# Patient Record
Sex: Female | Born: 1935 | Race: White | Hispanic: No | Smoking: Never smoker
Health system: Southern US, Community
[De-identification: ages and names within clinical notes are randomized; demographics above are authoritative.]

## PROBLEM LIST (undated history)

## (undated) DIAGNOSIS — M5136 Other intervertebral disc degeneration, lumbar region: Secondary | ICD-10-CM

## (undated) DIAGNOSIS — C801 Malignant (primary) neoplasm, unspecified: Secondary | ICD-10-CM

## (undated) HISTORY — PX: COLONOSCOPY W/ POLYPECTOMY: SHX1380

## (undated) HISTORY — PX: BREAST SURGERY: SHX581

---

## 2005-02-10 ENCOUNTER — Ambulatory Visit: Payer: Self-pay | Admitting: Cardiology

## 2007-12-24 ENCOUNTER — Ambulatory Visit: Payer: Self-pay | Admitting: Cardiology

## 2009-06-30 ENCOUNTER — Ambulatory Visit: Payer: Self-pay | Admitting: Cardiology

## 2010-01-03 ENCOUNTER — Encounter (INDEPENDENT_AMBULATORY_CARE_PROVIDER_SITE_OTHER): Payer: Self-pay | Admitting: Surgery

## 2010-01-03 ENCOUNTER — Ambulatory Visit (HOSPITAL_COMMUNITY): Admission: RE | Admit: 2010-01-03 | Discharge: 2010-01-04 | Payer: Self-pay | Admitting: Surgery

## 2010-07-22 LAB — SURGICAL PCR SCREEN
MRSA, PCR: NEGATIVE
Staphylococcus aureus: NEGATIVE

## 2010-07-22 LAB — URINALYSIS, ROUTINE W REFLEX MICROSCOPIC
Nitrite: POSITIVE — AB
Specific Gravity, Urine: 1.016 (ref 1.005–1.030)

## 2010-07-22 LAB — DIFFERENTIAL
Basophils Absolute: 0.1 10*3/uL (ref 0.0–0.1)
Basophils Relative: 2 % — ABNORMAL HIGH (ref 0–1)
Eosinophils Absolute: 0 10*3/uL (ref 0.0–0.7)
Eosinophils Relative: 1 % (ref 0–5)
Lymphocytes Relative: 40 % (ref 12–46)
Lymphs Abs: 1.4 10*3/uL (ref 0.7–4.0)
Monocytes Absolute: 0.3 10*3/uL (ref 0.1–1.0)
Monocytes Relative: 10 % (ref 3–12)
Neutro Abs: 1.7 10*3/uL (ref 1.7–7.7)
Neutrophils Relative %: 47 % (ref 43–77)

## 2010-07-22 LAB — CBC
HCT: 37 % (ref 36.0–46.0)
Hemoglobin: 12.5 g/dL (ref 12.0–15.0)
MCH: 31 pg (ref 26.0–34.0)
MCHC: 33.8 g/dL (ref 30.0–36.0)
MCV: 91.6 fL (ref 78.0–100.0)
Platelets: 116 10*3/uL — ABNORMAL LOW (ref 150–400)
RBC: 4.03 MIL/uL (ref 3.87–5.11)
RDW: 13 % (ref 11.5–15.5)
WBC: 3.6 10*3/uL — ABNORMAL LOW (ref 4.0–10.5)

## 2010-07-22 LAB — URINE MICROSCOPIC-ADD ON

## 2010-07-22 LAB — BASIC METABOLIC PANEL WITH GFR
BUN: 10 mg/dL (ref 6–23)
CO2: 28 meq/L (ref 19–32)
Calcium: 10.3 mg/dL (ref 8.4–10.5)
Chloride: 108 meq/L (ref 96–112)
Creatinine, Ser: 0.81 mg/dL (ref 0.4–1.2)
GFR calc non Af Amer: >60 mL/min
Glucose, Bld: 89 mg/dL (ref 70–99)
Potassium: 3.7 meq/L (ref 3.5–5.1)
Sodium: 141 meq/L (ref 135–145)

## 2010-07-22 LAB — PTH, INTACT AND CALCIUM
Calcium, Total (PTH): 10.5 mg/dL (ref 8.4–10.5)
PTH: 297.3 pg/mL — ABNORMAL HIGH (ref 14.0–72.0)

## 2010-07-22 LAB — CALCIUM: Calcium: 8.7 mg/dL (ref 8.4–10.5)

## 2010-07-22 LAB — PROTIME-INR
INR: 1.01 (ref 0.00–1.49)
Prothrombin Time: 13.5 s (ref 11.6–15.2)

## 2011-07-24 ENCOUNTER — Other Ambulatory Visit: Payer: Self-pay | Admitting: Hematology and Oncology

## 2011-07-24 DIAGNOSIS — C50911 Malignant neoplasm of unspecified site of right female breast: Secondary | ICD-10-CM

## 2011-07-29 ENCOUNTER — Other Ambulatory Visit: Payer: Self-pay

## 2011-09-08 IMAGING — CR DG CHEST 2V
2 series · 2 of 2 positions shown · non-contrast
Comparison: None.

CLINICAL DATA: Hyperparathyroidism.  Preoperative respiratory
evaluation prior to parathyroidectomy. Former smoker.

CHEST - 2 VIEW 12/30/2009:

[w chest pa]
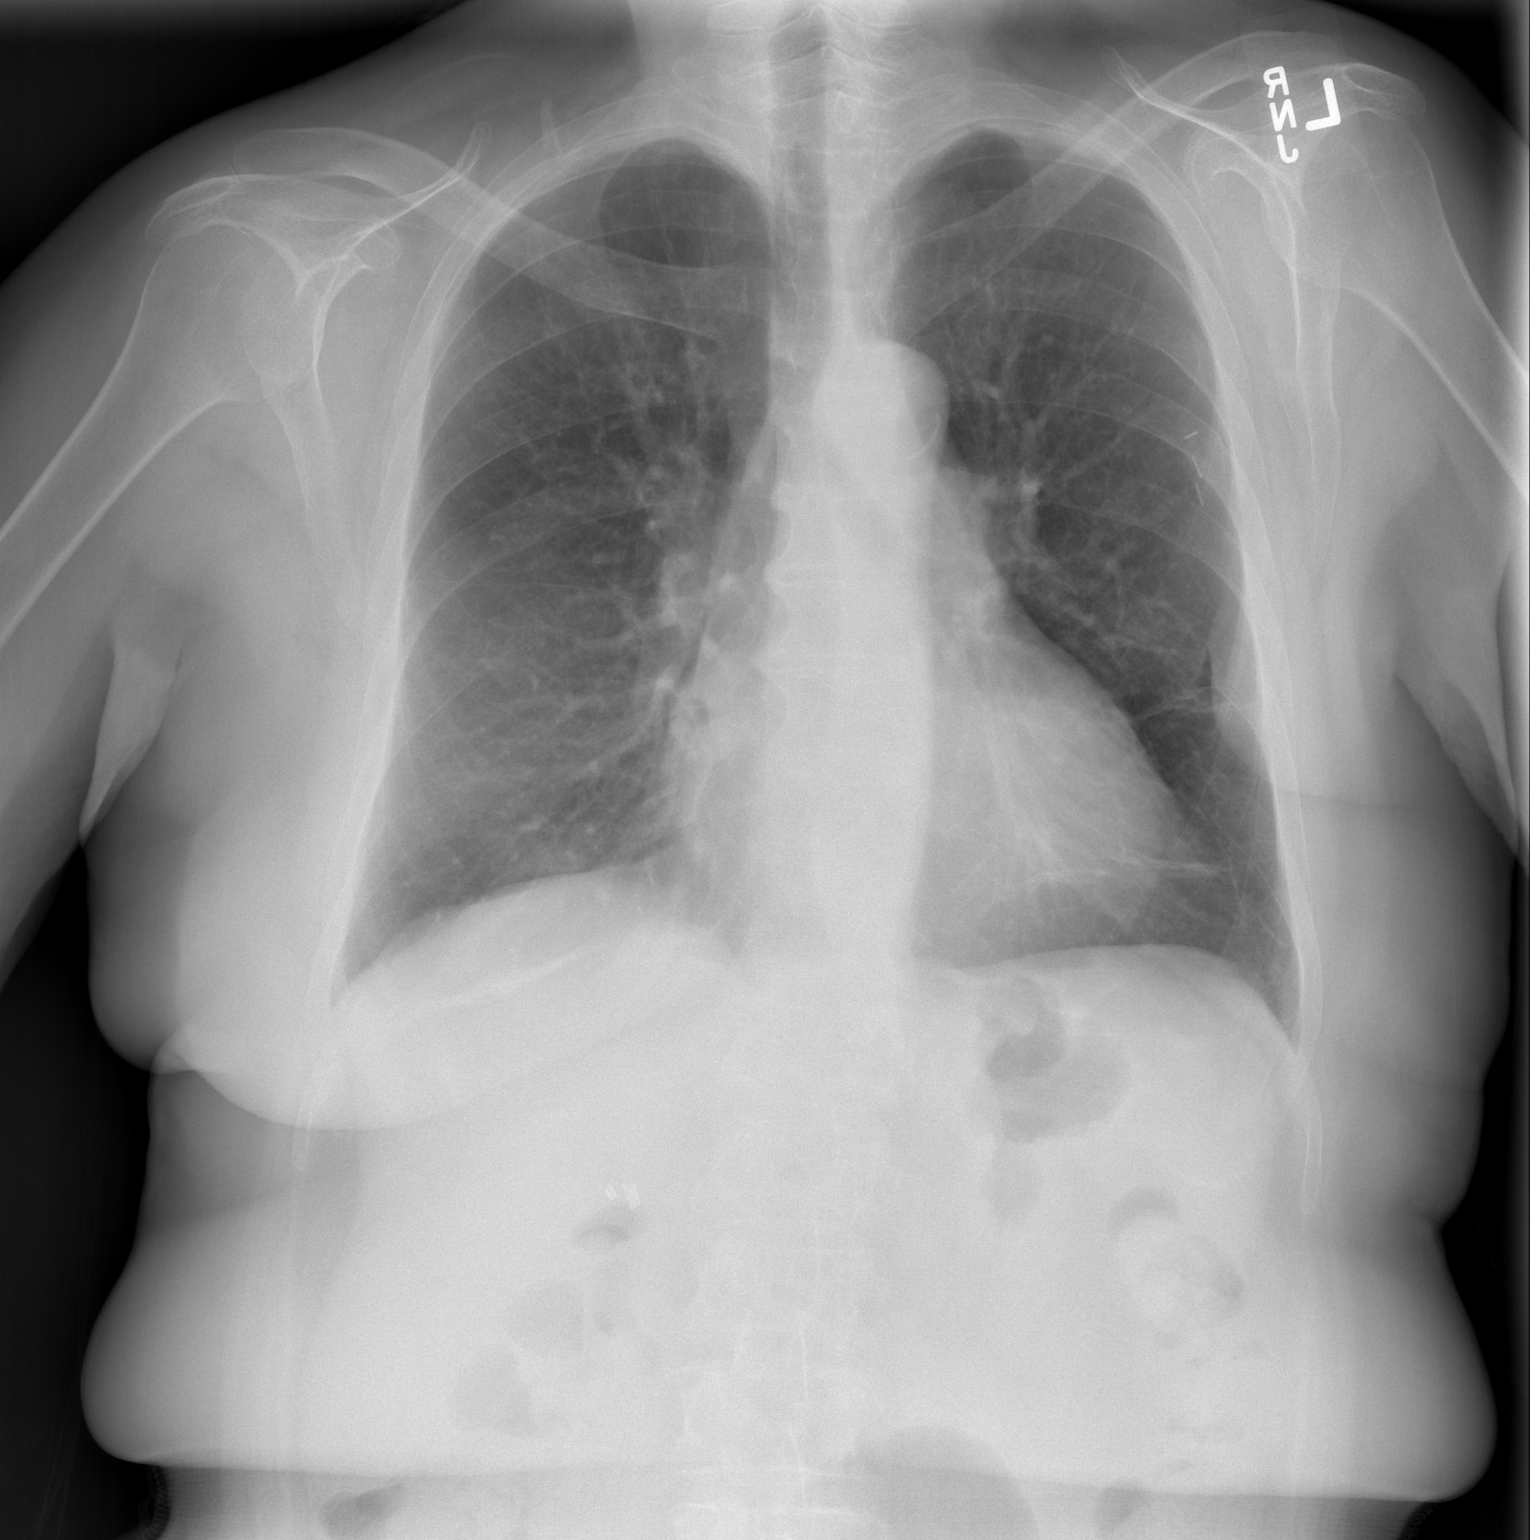

[w chest lat]
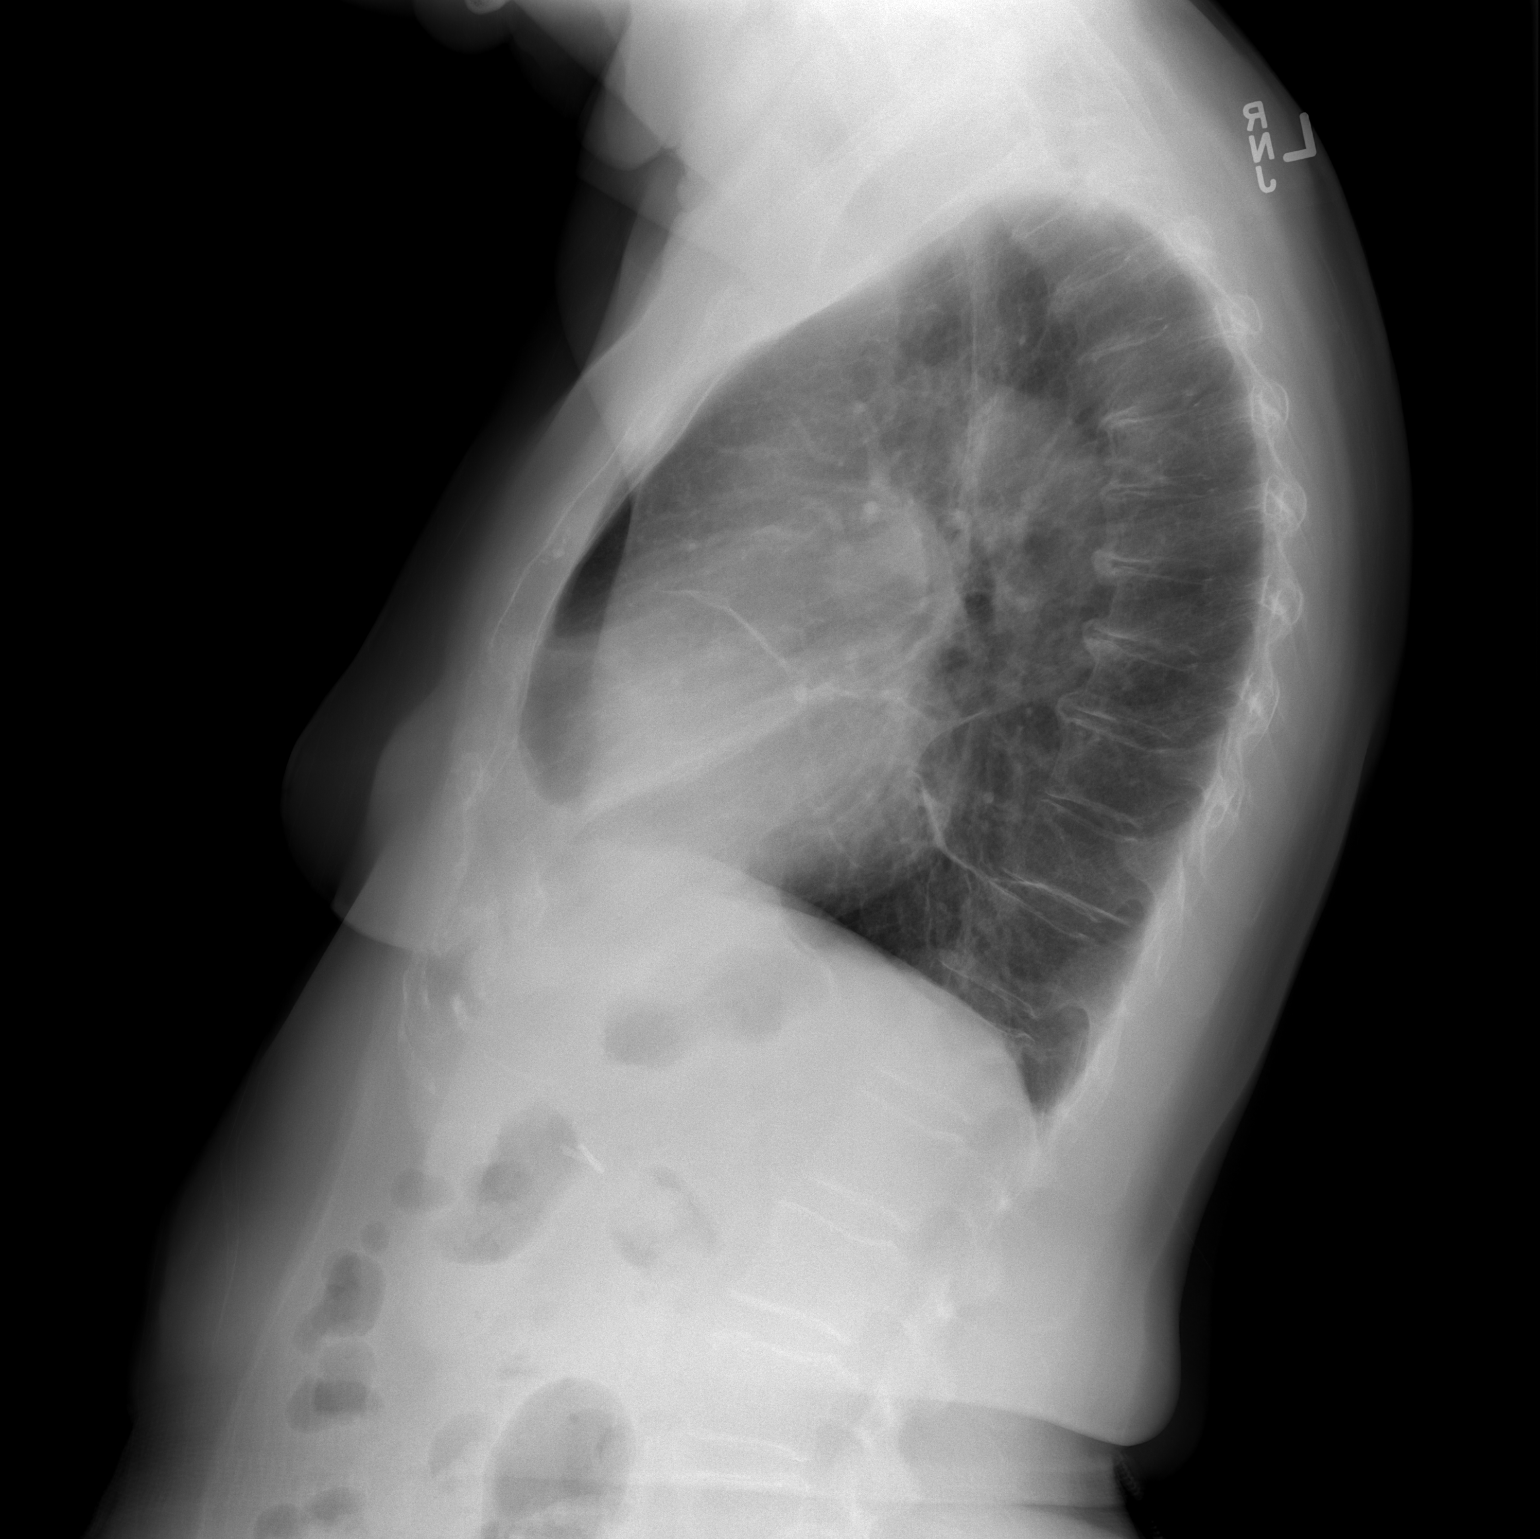

[2 of 2 positions shown; findings below may reference images not displayed]

FINDINGS: Cardiac silhouette upper normal in size to slightly
enlarged.  Thoracic aorta mildly atherosclerotic.  Hilar and
mediastinal contours otherwise unremarkable.  Linear opacities in
the lingula and both lower lobes.  Lungs otherwise clear.  Previous
left mastectomy and axillary node dissection.  No pleural
effusions.  Degenerative changes involving the thoracic spine.
IMPRESSION: Linear atelectasis or scarring in the lingula and both lower lobes.
No acute cardiopulmonary disease otherwise.  Borderline mild
cardiomegaly.

## 2011-09-25 ENCOUNTER — Encounter: Payer: Medicare Other | Admitting: Hematology and Oncology

## 2011-09-25 DIAGNOSIS — M810 Age-related osteoporosis without current pathological fracture: Secondary | ICD-10-CM

## 2011-09-25 DIAGNOSIS — E039 Hypothyroidism, unspecified: Secondary | ICD-10-CM

## 2011-09-25 DIAGNOSIS — C50919 Malignant neoplasm of unspecified site of unspecified female breast: Secondary | ICD-10-CM

## 2012-01-01 ENCOUNTER — Encounter: Payer: Self-pay | Admitting: Hematology and Oncology

## 2012-01-01 DIAGNOSIS — E785 Hyperlipidemia, unspecified: Secondary | ICD-10-CM

## 2012-01-01 DIAGNOSIS — M810 Age-related osteoporosis without current pathological fracture: Secondary | ICD-10-CM

## 2012-01-01 DIAGNOSIS — C50919 Malignant neoplasm of unspecified site of unspecified female breast: Secondary | ICD-10-CM

## 2012-02-20 DIAGNOSIS — C50919 Malignant neoplasm of unspecified site of unspecified female breast: Secondary | ICD-10-CM

## 2012-02-20 DIAGNOSIS — L905 Scar conditions and fibrosis of skin: Secondary | ICD-10-CM

## 2012-02-20 DIAGNOSIS — E876 Hypokalemia: Secondary | ICD-10-CM

## 2012-02-20 DIAGNOSIS — M810 Age-related osteoporosis without current pathological fracture: Secondary | ICD-10-CM

## 2012-03-01 DIAGNOSIS — C50919 Malignant neoplasm of unspecified site of unspecified female breast: Secondary | ICD-10-CM

## 2012-03-01 DIAGNOSIS — M810 Age-related osteoporosis without current pathological fracture: Secondary | ICD-10-CM

## 2012-03-01 DIAGNOSIS — E785 Hyperlipidemia, unspecified: Secondary | ICD-10-CM

## 2012-05-21 ENCOUNTER — Encounter: Payer: Medicare Other | Admitting: Internal Medicine

## 2012-05-21 DIAGNOSIS — D696 Thrombocytopenia, unspecified: Secondary | ICD-10-CM

## 2012-05-21 DIAGNOSIS — C50919 Malignant neoplasm of unspecified site of unspecified female breast: Secondary | ICD-10-CM

## 2012-05-21 DIAGNOSIS — E876 Hypokalemia: Secondary | ICD-10-CM

## 2012-08-09 DIAGNOSIS — M810 Age-related osteoporosis without current pathological fracture: Secondary | ICD-10-CM

## 2012-08-09 DIAGNOSIS — E785 Hyperlipidemia, unspecified: Secondary | ICD-10-CM

## 2012-08-09 DIAGNOSIS — E876 Hypokalemia: Secondary | ICD-10-CM

## 2012-08-09 DIAGNOSIS — C50919 Malignant neoplasm of unspecified site of unspecified female breast: Secondary | ICD-10-CM

## 2012-08-15 DIAGNOSIS — C50919 Malignant neoplasm of unspecified site of unspecified female breast: Secondary | ICD-10-CM

## 2012-08-15 DIAGNOSIS — M810 Age-related osteoporosis without current pathological fracture: Secondary | ICD-10-CM

## 2013-01-13 DIAGNOSIS — R55 Syncope and collapse: Secondary | ICD-10-CM

## 2013-02-25 DIAGNOSIS — D72819 Decreased white blood cell count, unspecified: Secondary | ICD-10-CM

## 2013-02-25 DIAGNOSIS — M810 Age-related osteoporosis without current pathological fracture: Secondary | ICD-10-CM

## 2013-02-25 DIAGNOSIS — M899 Disorder of bone, unspecified: Secondary | ICD-10-CM

## 2013-02-25 DIAGNOSIS — C50919 Malignant neoplasm of unspecified site of unspecified female breast: Secondary | ICD-10-CM

## 2013-02-25 DIAGNOSIS — D649 Anemia, unspecified: Secondary | ICD-10-CM

## 2013-02-25 DIAGNOSIS — D696 Thrombocytopenia, unspecified: Secondary | ICD-10-CM

## 2013-02-25 DIAGNOSIS — M949 Disorder of cartilage, unspecified: Secondary | ICD-10-CM

## 2013-03-18 DIAGNOSIS — E785 Hyperlipidemia, unspecified: Secondary | ICD-10-CM | POA: Insufficient documentation

## 2013-03-18 DIAGNOSIS — D649 Anemia, unspecified: Secondary | ICD-10-CM | POA: Insufficient documentation

## 2013-03-18 DIAGNOSIS — M810 Age-related osteoporosis without current pathological fracture: Secondary | ICD-10-CM | POA: Insufficient documentation

## 2013-03-18 DIAGNOSIS — D72819 Decreased white blood cell count, unspecified: Secondary | ICD-10-CM | POA: Insufficient documentation

## 2013-04-17 DIAGNOSIS — E876 Hypokalemia: Secondary | ICD-10-CM

## 2013-04-17 DIAGNOSIS — D72819 Decreased white blood cell count, unspecified: Secondary | ICD-10-CM

## 2013-04-17 DIAGNOSIS — C50919 Malignant neoplasm of unspecified site of unspecified female breast: Secondary | ICD-10-CM

## 2013-04-17 DIAGNOSIS — D696 Thrombocytopenia, unspecified: Secondary | ICD-10-CM

## 2013-05-09 DIAGNOSIS — S2249XA Multiple fractures of ribs, unspecified side, initial encounter for closed fracture: Secondary | ICD-10-CM | POA: Insufficient documentation

## 2013-05-09 DIAGNOSIS — Y92009 Unspecified place in unspecified non-institutional (private) residence as the place of occurrence of the external cause: Secondary | ICD-10-CM

## 2013-05-09 DIAGNOSIS — W19XXXA Unspecified fall, initial encounter: Secondary | ICD-10-CM | POA: Insufficient documentation

## 2013-05-09 DIAGNOSIS — R739 Hyperglycemia, unspecified: Secondary | ICD-10-CM | POA: Insufficient documentation

## 2013-05-09 DIAGNOSIS — S2239XA Fracture of one rib, unspecified side, initial encounter for closed fracture: Secondary | ICD-10-CM | POA: Insufficient documentation

## 2013-05-09 DIAGNOSIS — R4020 Unspecified coma: Secondary | ICD-10-CM | POA: Insufficient documentation

## 2013-05-13 DIAGNOSIS — E876 Hypokalemia: Secondary | ICD-10-CM | POA: Insufficient documentation

## 2013-06-11 ENCOUNTER — Emergency Department (HOSPITAL_COMMUNITY)
Admission: EM | Admit: 2013-06-11 | Discharge: 2013-06-11 | Disposition: A | Discharge status: Home / Self Care | Payer: Medicare Other | Attending: Emergency Medicine | Admitting: Emergency Medicine

## 2013-06-11 ENCOUNTER — Encounter (HOSPITAL_COMMUNITY): Payer: Self-pay | Admitting: Emergency Medicine

## 2013-06-11 ENCOUNTER — Emergency Department (HOSPITAL_COMMUNITY): Payer: Medicare Other

## 2013-06-11 DIAGNOSIS — IMO0002 Reserved for concepts with insufficient information to code with codable children: Secondary | ICD-10-CM | POA: Insufficient documentation

## 2013-06-11 DIAGNOSIS — Y939 Activity, unspecified: Secondary | ICD-10-CM | POA: Insufficient documentation

## 2013-06-11 DIAGNOSIS — W06XXXA Fall from bed, initial encounter: Secondary | ICD-10-CM | POA: Insufficient documentation

## 2013-06-11 DIAGNOSIS — S298XXA Other specified injuries of thorax, initial encounter: Secondary | ICD-10-CM | POA: Insufficient documentation

## 2013-06-11 DIAGNOSIS — Z853 Personal history of malignant neoplasm of breast: Secondary | ICD-10-CM | POA: Insufficient documentation

## 2013-06-11 DIAGNOSIS — R0781 Pleurodynia: Secondary | ICD-10-CM

## 2013-06-11 DIAGNOSIS — Y9289 Other specified places as the place of occurrence of the external cause: Secondary | ICD-10-CM | POA: Insufficient documentation

## 2013-06-11 DIAGNOSIS — Z79899 Other long term (current) drug therapy: Secondary | ICD-10-CM | POA: Insufficient documentation

## 2013-06-11 DIAGNOSIS — Z880 Allergy status to penicillin: Secondary | ICD-10-CM | POA: Insufficient documentation

## 2013-06-11 DIAGNOSIS — Z7982 Long term (current) use of aspirin: Secondary | ICD-10-CM | POA: Insufficient documentation

## 2013-06-11 DIAGNOSIS — Z8781 Personal history of (healed) traumatic fracture: Secondary | ICD-10-CM | POA: Insufficient documentation

## 2013-06-11 HISTORY — DX: Malignant (primary) neoplasm, unspecified: C80.1

## 2013-06-11 MED ORDER — HYDROCODONE-ACETAMINOPHEN 5-325 MG PO TABS
1.0000 | ORAL_TABLET | Freq: Once | ORAL | Status: AC
Start: 2013-06-11 — End: 2013-06-11
  Administered 2013-06-11: 1 via ORAL
  Filled 2013-06-11: qty 1

## 2013-06-11 MED ORDER — HYDROCODONE-ACETAMINOPHEN 10-325 MG PO TABS
1.0000 | ORAL_TABLET | Freq: Four times a day (QID) | ORAL | Status: DC | PRN
Start: 2013-06-11 — End: 2014-02-21

## 2013-06-11 NOTE — Discharge Instructions (Signed)
As discussed, with your rib injuries it is very important that you monitor your condition carefully, and be sure to use your incentive spirometer 4 times daily while he recovered.  Return here for concerning changes in your condition, otherwise, please be sure to follow up with your physician.

## 2013-06-11 NOTE — ED Provider Notes (Signed)
CSN: [id number]     Arrival date & time 06/11/13  1642 History   First MD Initiated Contact with Patient 06/11/13 1917     Chief Complaint  Patient presents with  . rib pain   . Back Pain    HPI  Patient presents with increasing pain about the left mid lateral thorax. Just prior to arrival the patient had a fall. She will golf of a child's bed. Since that time she's had increasing pain in the area. Pain is severe, sore, nonradiating. There is no new dyspnea, other chest pain, lightheadedness, syncope, head pain, neck pain.  Patient's history is notable for a fall one month ago that resulted in multiple rib fractures, with a pneumothorax requiring hospitalization at another facility for several days.   Past Medical History  Diagnosis Date  . Cancer     breast cancer   Past Surgical History  Procedure Laterality Date  . Breast surgery      double mastectomy   No family history on file. History  Substance Use Topics  . Smoking status: Never Smoker   . Smokeless tobacco: Not on file  . Alcohol Use: No   OB History   Grav Para Term Preterm Abortions TAB SAB Ect Mult Living                 Review of Systems  Constitutional:       Per HPI, otherwise negative  HENT:       Per HPI, otherwise negative  Respiratory:       Per HPI, otherwise negative  Cardiovascular:       Per HPI, otherwise negative  Gastrointestinal: Negative for nausea and vomiting.  Endocrine:       Negative aside from HPI  Genitourinary:       Neg aside from HPI   Musculoskeletal:       Per HPI, otherwise negative  Skin: Negative for wound.  Neurological: Negative for syncope.    Allergies  Penicillins and Tape  Home Medications   Current Outpatient Rx  Name  Route  Sig  Dispense  Refill  . aspirin EC 81 MG tablet   Oral   Take 81 mg by mouth daily.         Marland Kitchen atorvastatin (LIPITOR) 10 MG tablet   Oral   Take 10 mg by mouth daily.         . Cholecalciferol (VITAMIN D PO)   Oral   Take 1 capsule by mouth daily.         Marland Kitchen letrozole (FEMARA) 2.5 MG tablet   Oral   Take 2.5 mg by mouth daily.         . potassium chloride SA (K-DUR,KLOR-CON) 20 MEQ tablet   Oral   Take 40 mEq by mouth daily.         Marland Kitchen HYDROcodone-acetaminophen (NORCO) 10-325 MG per tablet   Oral   Take 1 tablet by mouth every 6 (six) hours as needed.   20 tablet   0    BP 123/87  Pulse 110  Temp(Src) 98.3 F (36.8 C) (Oral)  Resp 18  SpO2 95% Physical Exam  Nursing note and vitals reviewed. Constitutional: She is oriented to person, place, and time. She appears well-developed and well-nourished. No distress.  HENT:  Head: Normocephalic and atraumatic.  Eyes: Conjunctivae and EOM are normal.  Cardiovascular: Normal rate and regular rhythm.   Pulmonary/Chest: Effort normal and breath sounds normal. No stridor. No respiratory distress.  Abdominal: She exhibits no distension.  Musculoskeletal: She exhibits no edema.  Neurological: She is alert and oriented to person, place, and time. No cranial nerve deficit.  Skin: Skin is warm and dry.  Psychiatric: She has a normal mood and affect.    ED Course  Procedures (including critical care time) Labs Review Labs Reviewed - No data to display Imaging Review Dg Ribs Unilateral W/chest Left  06/11/2013   CLINICAL DATA:  Status post fall 05/08/2013 with multiple left rib fractures. Patient status post fall today.  EXAM: LEFT RIBS AND CHEST - 3+ VIEW  COMPARISON:  CT chest, abdomen and pelvis 01/13/2013. PA and lateral chest 05/09/2013.  FINDINGS: There are fractures of the left fifth through eighth ribs which appear acute or subacute. There may be a left fifth rib fracture on the prior study. No pneumothorax is identified. Right upper lobe scar is unchanged. Heart size is upper normal.  IMPRESSION: Acute or subacute left fifth through eighth rib fractures. No pneumothorax.   Electronically Signed   By: Inge Rise M.D.   On: 06/11/2013  18:26    During the initial evaluation I reviewed the x-ray images from today with the patient and her daughter.  We compared to his images with those from images immediately following her event. I agree with the interpretation.   EKG Interpretation   None       MDM   1. Rib pain on left side    Patient presents after a mechanical fall with increasing pain in that area that she previously injured.  There is no evidence of pneumothorax, distress, hypoxia.  Patient additional analgesia provided here, was in no distress, appropriate for further evaluation, management, monitoring as an outpatient.    Carmin Muskrat, MD 06/11/13 2011

## 2013-06-11 NOTE — ED Notes (Addendum)
Pt reports accidentally rolled off of bed and c/o pain in lower back and left ribs.  Denies any SOB.  Reports hurts to breathe.  Pt reports is living with her grand daughter at this time because pt fell in Jan and fractured ribs on left side.

## 2014-02-21 ENCOUNTER — Emergency Department (HOSPITAL_COMMUNITY)
Admission: EM | Admit: 2014-02-21 | Discharge: 2014-02-21 | Disposition: A | Discharge status: Home / Self Care | Payer: Medicare Other | Attending: Emergency Medicine | Admitting: Emergency Medicine

## 2014-02-21 ENCOUNTER — Emergency Department (HOSPITAL_COMMUNITY): Payer: Medicare Other

## 2014-02-21 ENCOUNTER — Encounter (HOSPITAL_COMMUNITY): Payer: Self-pay | Admitting: Emergency Medicine

## 2014-02-21 DIAGNOSIS — Z7982 Long term (current) use of aspirin: Secondary | ICD-10-CM | POA: Diagnosis not present

## 2014-02-21 DIAGNOSIS — N390 Urinary tract infection, site not specified: Secondary | ICD-10-CM

## 2014-02-21 DIAGNOSIS — M545 Low back pain, unspecified: Secondary | ICD-10-CM

## 2014-02-21 DIAGNOSIS — Z853 Personal history of malignant neoplasm of breast: Secondary | ICD-10-CM | POA: Diagnosis not present

## 2014-02-21 DIAGNOSIS — Z79899 Other long term (current) drug therapy: Secondary | ICD-10-CM | POA: Diagnosis not present

## 2014-02-21 DIAGNOSIS — Z880 Allergy status to penicillin: Secondary | ICD-10-CM | POA: Diagnosis not present

## 2014-02-21 LAB — URINE MICROSCOPIC-ADD ON

## 2014-02-21 LAB — URINALYSIS, ROUTINE W REFLEX MICROSCOPIC
Bilirubin Urine: NEGATIVE
GLUCOSE, UA: NEGATIVE mg/dL
Ketones, ur: NEGATIVE mg/dL
Nitrite: POSITIVE — AB
PROTEIN: NEGATIVE mg/dL
Specific Gravity, Urine: 1.025 (ref 1.005–1.030)
Urobilinogen, UA: 0.2 mg/dL (ref 0.0–1.0)
pH: 6 (ref 5.0–8.0)

## 2014-02-21 MED ORDER — TRAMADOL HCL 50 MG PO TABS: 50.0000 mg | ORAL_TABLET | Freq: Four times a day (QID) | ORAL | Status: DC | PRN

## 2014-02-21 MED ORDER — STERILE WATER FOR INJECTION IJ SOLN
INTRAMUSCULAR | Status: AC
  Administered 2014-02-21: 10 mL
  Filled 2014-02-21: qty 10

## 2014-02-21 MED ORDER — CEPHALEXIN 500 MG PO CAPS: 500.0000 mg | ORAL_CAPSULE | Freq: Four times a day (QID) | ORAL | Status: DC

## 2014-02-21 MED ORDER — CEFTRIAXONE SODIUM 1 G IJ SOLR
1.0000 g | Freq: Once | INTRAMUSCULAR | Status: AC
  Administered 2014-02-21: 1 g via INTRAMUSCULAR
  Filled 2014-02-21: qty 10

## 2014-02-21 NOTE — ED Notes (Signed)
Pt states lower back started hurting after cleaning apartment. Denies injury.

## 2014-02-21 NOTE — ED Notes (Signed)
MD at bedside. 

## 2014-02-21 NOTE — Discharge Instructions (Signed)
Back Pain, Adult °Low back pain is very common. About 1 in 5 people have back pain. The cause of low back pain is rarely dangerous. The pain often gets better over time. About half of people with a sudden onset of back pain feel better in just 2 weeks. About 8 in 10 people feel better by 6 weeks.  °CAUSES °Some common causes of back pain include: °· Strain of the muscles or ligaments supporting the spine. °· Wear and tear (degeneration) of the spinal discs. °· Arthritis. °· Direct injury to the back. °DIAGNOSIS °Most of the time, the direct cause of low back pain is not known. However, back pain can be treated effectively even when the exact cause of the pain is unknown. Answering your caregiver's questions about your overall health and symptoms is one of the most accurate ways to make sure the cause of your pain is not dangerous. If your caregiver needs more information, he or she may order lab work or imaging tests (X-rays or MRIs). However, even if imaging tests show changes in your back, this usually does not require surgery. °HOME CARE INSTRUCTIONS °For many people, back pain returns. Since low back pain is rarely dangerous, it is often a condition that people can learn to manage on their own.  °· Remain active. It is stressful on the back to sit or stand in one place. Do not sit, drive, or stand in one place for more than 30 minutes at a time. Take short walks on level surfaces as soon as pain allows. Try to increase the length of time you walk each day. °· Do not stay in bed. Resting more than 1 or 2 days can delay your recovery. °· Do not avoid exercise or work. Your body is made to move. It is not dangerous to be active, even though your back may hurt. Your back will likely heal faster if you return to being active before your pain is gone. °· Pay attention to your body when you  bend and lift. Many people have less discomfort when lifting if they bend their knees, keep the load close to their bodies, and  avoid twisting. Often, the most comfortable positions are those that put less stress on your recovering back. °· Find a comfortable position to sleep. Use a firm mattress and lie on your side with your knees slightly bent. If you lie on your back, put a pillow under your knees. °· Only take over-the-counter or prescription medicines as directed by your caregiver. Over-the-counter medicines to reduce pain and inflammation are often the most helpful. Your caregiver may prescribe muscle relaxant drugs. These medicines help dull your pain so you can more quickly return to your normal activities and healthy exercise. °· Put ice on the injured area. °¨ Put ice in a plastic bag. °¨ Place a towel between your skin and the bag. °¨ Leave the ice on for 15-20 minutes, 03-04 times a day for the first 2 to 3 days. After that, ice and heat may be alternated to reduce pain and spasms. °· Ask your caregiver about trying back exercises and gentle massage. This may be of some benefit. °· Avoid feeling anxious or stressed. Stress increases muscle tension and can worsen back pain. It is important to recognize when you are anxious or stressed and learn ways to manage it. Exercise is a great option. °SEEK MEDICAL CARE IF: °· You have pain that is not relieved with rest or medicine. °· You have pain that does not improve in 1 week. °· You have new symptoms. °· You are generally not feeling well. °SEEK   IMMEDIATE MEDICAL CARE IF:  °· You have pain that radiates from your back into your legs. °· You develop new bowel or bladder control problems. °· You have unusual weakness or numbness in your arms or legs. °· You develop nausea or vomiting. °· You develop abdominal pain. °· You feel faint. °Document Released: 04/24/2005 Document Revised: 10/24/2011 Document Reviewed: 08/26/2013 °ExitCare® Patient Information ©2015 [employer]. This information is not intended to replace advice given to you by your health care provider. Make sure you  discuss any questions you have with your health care provider. ° °Urinary Tract Infection °Urinary tract infections (UTIs) can develop anywhere along your urinary tract. Your urinary tract is your body's drainage system for removing wastes and extra water. Your urinary tract includes two kidneys, two ureters, a bladder, and a urethra. Your kidneys are a pair of bean-shaped organs. Each kidney is about the size of your fist. They are located below your ribs, one on each side of your spine. °CAUSES °Infections are caused by microbes, which are microscopic organisms, including fungi, viruses, and bacteria. These organisms are so small that they can only be seen through a microscope. Bacteria are the microbes that most commonly cause UTIs. °SYMPTOMS  °Symptoms of UTIs may vary by age and gender of the patient and by the location of the infection. Symptoms in young women typically include a frequent and intense urge to urinate and a painful, burning feeling in the bladder or urethra during urination. Older women and men are more likely to be tired, shaky, and weak and have muscle aches and abdominal pain. A fever may mean the infection is in your kidneys. Other symptoms of a kidney infection include pain in your back or sides below the ribs, nausea, and vomiting. °DIAGNOSIS °To diagnose a UTI, your caregiver will ask you about your symptoms. Your caregiver also will ask to provide a urine sample. The urine sample will be tested for bacteria and white blood cells. White blood cells are made by your body to help fight infection. °TREATMENT  °Typically, UTIs can be treated with medication. Because most UTIs are caused by a bacterial infection, they usually can be treated with the use of antibiotics. The choice of antibiotic and length of treatment depend on your symptoms and the type of bacteria causing your infection. °HOME CARE INSTRUCTIONS °· If you were prescribed antibiotics, take them exactly as your caregiver  instructs you. Finish the medication even if you feel better after you have only taken some of the medication. °· Drink enough water and fluids to keep your urine clear or pale yellow. °· Avoid caffeine, tea, and carbonated beverages. They tend to irritate your bladder. °· Empty your bladder often. Avoid holding urine for long periods of time. °· Empty your bladder before and after sexual intercourse. °· After a bowel movement, women should cleanse from front to back. Use each tissue only once. °SEEK MEDICAL CARE IF:  °· You have back pain. °· You develop a fever. °· Your symptoms do not begin to resolve within 3 days. °SEEK IMMEDIATE MEDICAL CARE IF:  °· You have severe back pain or lower abdominal pain. °· You develop chills. °· You have nausea or vomiting. °· You have continued burning or discomfort with urination. °MAKE SURE YOU:  °· Understand these instructions. °· Will watch your condition. °· Will get help right away if you are not doing well or get worse. °Document Released: 02/01/2005 Document Revised: 10/24/2011 Document Reviewed: 06/02/2011 °ExitCare® Patient Information ©  2015 [employer]. This information is not intended to replace advice given to you by your health care provider. Make sure you discuss any questions you have with your health care provider.

## 2014-02-21 NOTE — ED Provider Notes (Signed)
CSN: [id number]     Arrival date & time 02/21/14  1447 History   First MD Initiated Contact with Patient 02/21/14 1507     Chief Complaint  Patient presents with  . Back Pain     (Consider location/radiation/quality/duration/timing/severity/associated sxs/prior Treatment) HPI Comments: Patient presents to the ER for evaluation of low back pain. Patient reports that she injured her back 3 days ago. Patient reports that she was cleaning and pick up an object, felt sudden pain in the lower back. Patient reports sharp pain in the lower back that worsens with movement. Pain does not radiate to the legs. No numbness, tingling or weakness in the lower extremities. No change in bowel or bladder function.  Patient is a 78 y.o. female presenting with back pain.  Back Pain   Past Medical History  Diagnosis Date  . Cancer     breast cancer   Past Surgical History  Procedure Laterality Date  . Breast surgery      double mastectomy   History reviewed. No pertinent family history. History  Substance Use Topics  . Smoking status: Never Smoker   . Smokeless tobacco: Not on file  . Alcohol Use: No   OB History   Grav Para Term Preterm Abortions TAB SAB Ect Mult Living                 Review of Systems  Musculoskeletal: Positive for back pain.  All other systems reviewed and are negative.     Allergies  Penicillins and Tape  Home Medications   Prior to Admission medications   Medication Sig Start Date End Date Taking? Authorizing Provider  aspirin EC 81 MG tablet Take 81 mg by mouth daily.   Yes Historical Provider, MD  atorvastatin (LIPITOR) 10 MG tablet Take 10 mg by mouth daily. 04/06/13  Yes Historical Provider, MD  busPIRone (BUSPAR) 10 MG tablet Take 10 mg by mouth daily. 12/22/13  Yes Historical Provider, MD  Cholecalciferol (VITAMIN D3) 2000 UNITS TABS Take 1 tablet by mouth daily.   Yes Historical Provider, MD  letrozole (FEMARA) 2.5 MG tablet Take 2.5 mg by mouth daily.  04/06/13  Yes Historical Provider, MD  potassium chloride SA (K-DUR,KLOR-CON) 20 MEQ tablet Take 40 mEq by mouth daily.   Yes Historical Provider, MD   BP 155/88  Pulse 95  Temp(Src) 97.9 F (36.6 C) (Oral)  Resp 17  Ht 5\' 5"  (1.651 m)  Wt 122 lb (55.339 kg)  BMI 20.30 kg/m2  SpO2 97% Physical Exam  Constitutional: She is oriented to person, place, and time. She appears well-developed and well-nourished. No distress.  HENT:  Head: Normocephalic and atraumatic.  Right Ear: Hearing normal.  Left Ear: Hearing normal.  Nose: Nose normal.  Mouth/Throat: Oropharynx is clear and moist and mucous membranes are normal.  Eyes: Conjunctivae and EOM are normal. Pupils are equal, round, and reactive to light.  Neck: Normal range of motion. Neck supple.  Cardiovascular: Regular rhythm, S1 normal and S2 normal.  Exam reveals no gallop and no friction rub.   No murmur heard. Pulmonary/Chest: Effort normal and breath sounds normal. No respiratory distress. She exhibits no tenderness.  Abdominal: Soft. Normal appearance and bowel sounds are normal. There is no hepatosplenomegaly. There is no tenderness. There is no rebound, no guarding, no tenderness at McBurney's point and negative Murphy's sign. No hernia.  Musculoskeletal: Normal range of motion.       Lumbar back: She exhibits tenderness.  Back:  Neurological: She is alert and oriented to person, place, and time. She has normal strength. No cranial nerve deficit or sensory deficit. Coordination normal. GCS eye subscore is 4. GCS verbal subscore is 5. GCS motor subscore is 6.  Reflex Scores:      Patellar reflexes are 2+ on the right side and 2+ on the left side. Negative straight leg raise bilaterally  Skin: Skin is warm, dry and intact. No rash noted. No cyanosis.  Psychiatric: She has a normal mood and affect. Her speech is normal and behavior is normal. Thought content normal.    ED Course  Procedures (including critical care  time) Labs Review Labs Reviewed  URINALYSIS, ROUTINE W REFLEX MICROSCOPIC - Abnormal; Notable for the following:    APPearance CLOUDY (*)    Hgb urine dipstick TRACE (*)    Nitrite POSITIVE (*)    Leukocytes, UA SMALL (*)    All other components within normal limits  URINE MICROSCOPIC-ADD ON - Abnormal; Notable for the following:    Bacteria, UA MANY (*)    All other components within normal limits  URINE CULTURE    Imaging Review Dg Lumbar Spine Complete  02/21/2014   CLINICAL DATA:  Low back pain 3 days centrally after bending injury.  EXAM: LUMBAR SPINE - COMPLETE 4+ VIEW  COMPARISON:  07/09/2013  FINDINGS: Vertebral body alignment and heights are normal. Disc space heights are relatively well preserved. There is mild spondylosis throughout the lumbar spine. There is no compression fracture or subluxation. There is mild facet arthropathy. There is calcified plaque over the abdominal aorta.  IMPRESSION: Mild spondylosis of the lumbar spine.  No acute findings.   Electronically Signed   By: Marin Olp M.D.   On: 02/21/2014 16:12     EKG Interpretation None      MDM   Final diagnoses:  None   back pain UTI    To the ER for evaluation of low back pain. Patient reports an injury 3 days ago while cleaning her apartment. She does have an element of musculoskeletal back pain. Pain is in the lower back and worsens with movement. She has tenderness without radiation. Normal neurologic exam. Patient does, however, have an abnormal urinalysis. She'll be treated for UTI and also given analgesia for back pain. Followup with primary doctor.    Orpah Greek, MD 02/21/14 [phone]

## 2014-02-24 ENCOUNTER — Encounter: Payer: Self-pay | Admitting: Cardiovascular Disease

## 2014-02-24 ENCOUNTER — Telehealth (HOSPITAL_COMMUNITY): Payer: Self-pay

## 2014-02-24 NOTE — ED Notes (Signed)
Post ED Visit - Positive Culture Follow-up  Culture report reviewed by antimicrobial stewardship pharmacist: []  Wes Kerkhoven, Pharm.D., BCPS [x]  Heide Guile, Pharm.D., BCPS []  Alycia Rossetti, Pharm.D., BCPS []  [place], Pharm.D., BCPS, AAHIVP []  Legrand Como, Pharm.D., BCPS, AAHIVP []  Carly Sabat, Pharm.D. []  Elenor Quinones, Pharm.D.  Positive urine culture Treated with cephalexin, organism sensitive to the same and no further patient follow-up is required at this time.  Ileene Musa 02/24/2014, 11:18 AM

## 2014-02-25 LAB — URINE CULTURE

## 2014-02-26 ENCOUNTER — Telehealth (HOSPITAL_COMMUNITY): Payer: Self-pay

## 2014-02-26 NOTE — ED Notes (Signed)
Post ED Visit - Positive Culture Follow-up  Culture report reviewed by antimicrobial stewardship pharmacist: [x]  Wes [place], Pharm.D., BCPS []  Heide Guile, Pharm.D., BCPS []  Alycia Rossetti, Pharm.D., BCPS []  [place], Pharm.D., BCPS, AAHIVP []  Legrand Como, Pharm.D., BCPS, AAHIVP []  Carly Sabat, Pharm.D. []  Elenor Quinones, Pharm.D.  Positive urine culture Treated with cephalexin, organism sensitive to the same and no further patient follow-up is required at this time.  Ileene Musa 02/26/2014, 12:53 PM

## 2014-03-03 ENCOUNTER — Telehealth: Payer: Self-pay | Admitting: *Deleted

## 2014-03-03 ENCOUNTER — Encounter: Payer: Self-pay | Admitting: Cardiovascular Disease

## 2014-03-03 ENCOUNTER — Ambulatory Visit (INDEPENDENT_AMBULATORY_CARE_PROVIDER_SITE_OTHER): Payer: Medicare Other | Admitting: Cardiovascular Disease

## 2014-03-03 VITALS — BP 115/78 | HR 93 | Ht 65.5 in | Wt 117.0 lb

## 2014-03-03 DIAGNOSIS — R9431 Abnormal electrocardiogram [ECG] [EKG]: Secondary | ICD-10-CM | POA: Insufficient documentation

## 2014-03-03 DIAGNOSIS — Z01818 Encounter for other preprocedural examination: Secondary | ICD-10-CM

## 2014-03-03 DIAGNOSIS — I4439 Other atrioventricular block: Secondary | ICD-10-CM

## 2014-03-03 DIAGNOSIS — I454 Nonspecific intraventricular block: Secondary | ICD-10-CM

## 2014-03-03 NOTE — Telephone Encounter (Signed)
Dr. Anthony Sar notified that patient is clear to proceed as planned for surgery tomorrow.  Office note faxed to:  [phone] Maudie Mercury).

## 2014-03-03 NOTE — Progress Notes (Signed)
Patient ID: Paige Guerrero, female   DOB: 27-Aug-1935, 78 y.o.   MRN: [id number]       CARDIOLOGY CONSULT NOTE  Patient ID: CIANNI MANNY MRN: [id number] DOB/AGE: 1936-03-04 78 y.o.  Admit date: (Not on file) Primary Physician Rory Percy, MD  Reason for Consultation: abnormal ECG  HPI: The patient is a 78 year old woman who has been referred for an abnormal ECG, performed as part of a preoperative evaluation. She has a history of hyperlipidemia and takes Lipitor 10 mg daily. ECG performed on 12/14/2001 demonstrated normal sinus rhythm. She recently had a colonoscopy and had a lesion in the ascending colon which was removed. She had a second lesion and had multiple biopsies performed for found to be benign. She is being scheduled for surgical resection to remove an additional polyp on 03/04/14. Recent labs performed on 03/02/2014 demonstrate white blood cells 2.9, hemoglobin 12.8, platelets 135, BUN 17, creatinine 0.71, AST 22, ALT 14, total bilirubin 1.3, sodium 140, potassium 3.7. ECG performed at [facility] demonstrated normal sinus rhythm, heart rate 84 bpm, PVCs, and possible old anteroseptal infarct. ECG performed in the office today demonstrates normal sinus rhythm with underlying right bundle branch block and an isolated PVC, heart rate 94 bpm, QRS duration of 144 ms.  She denies chest pain altogether. She has to climb 16 steps to her apartment and does so 4 times every day without exertional chest pain. She may have some mild shortness of breath by the time she reaches the top of the stairs but this has been stable and chronic. She denies orthopnea, leg swelling, and paroxysmal nocturnal dyspnea. She denies dizziness, palpitations, lightheadedness and syncope. She passed out last Christmas Eve after drinking 4 shots of alcohol, but does not drink alcohol regularly. She has not been hospitalized this year to the best of her knowledge. She denies hematochezia, melena, nausea,  vomiting, and abdominal pain. She does not smoke.  Soc: Nonsmoker.  FamHx: Noncontributory.  Allergies  Allergen Reactions  . Penicillins Rash  . Tape Rash    Current Outpatient Prescriptions  Medication Sig Dispense Refill  . aspirin EC 81 MG tablet Take 81 mg by mouth daily.      Marland Kitchen atorvastatin (LIPITOR) 10 MG tablet Take 10 mg by mouth daily.      . busPIRone (BUSPAR) 10 MG tablet Take 10 mg by mouth daily.      . cephALEXin (KEFLEX) 500 MG capsule Take 1 capsule (500 mg total) by mouth 4 (four) times daily.  40 capsule  0  . Cholecalciferol (VITAMIN D3) 2000 UNITS TABS Take 1 tablet by mouth daily.      Marland Kitchen letrozole (FEMARA) 2.5 MG tablet Take 2.5 mg by mouth daily.      . potassium chloride SA (K-DUR,KLOR-CON) 20 MEQ tablet Take 40 mEq by mouth daily.      . traMADol (ULTRAM) 50 MG tablet Take 1 tablet (50 mg total) by mouth every 6 (six) hours as needed.  15 tablet  0   No current facility-administered medications for this visit.    Past Medical History  Diagnosis Date  . Cancer     breast cancer    Past Surgical History  Procedure Laterality Date  . Breast surgery      double mastectomy    History   Social History  . Marital Status: Divorced    Spouse Name: N/A    Number of Children: N/A  . Years of Education: N/A   Occupational History  .  Not on file.   Social History Main Topics  . Smoking status: Never Smoker   . Smokeless tobacco: Never Used  . Alcohol Use: No  . Drug Use: No  . Sexual Activity: Not on file   Other Topics Concern  . Not on file   Social History Narrative  . No narrative on file      Prior to Admission medications   Medication Sig Start Date End Date Taking? Authorizing Provider  aspirin EC 81 MG tablet Take 81 mg by mouth daily.   Yes Historical Provider, MD  atorvastatin (LIPITOR) 10 MG tablet Take 10 mg by mouth daily. 04/06/13  Yes Historical Provider, MD  busPIRone (BUSPAR) 10 MG tablet Take 10 mg by mouth daily.  12/22/13  Yes Historical Provider, MD  cephALEXin (KEFLEX) 500 MG capsule Take 1 capsule (500 mg total) by mouth 4 (four) times daily. 02/21/14  Yes Orpah Greek, MD  Cholecalciferol (VITAMIN D3) 2000 UNITS TABS Take 1 tablet by mouth daily.   Yes Historical Provider, MD  letrozole (FEMARA) 2.5 MG tablet Take 2.5 mg by mouth daily. 04/06/13  Yes Historical Provider, MD  potassium chloride SA (K-DUR,KLOR-CON) 20 MEQ tablet Take 40 mEq by mouth daily.   Yes Historical Provider, MD  traMADol (ULTRAM) 50 MG tablet Take 1 tablet (50 mg total) by mouth every 6 (six) hours as needed. 02/21/14  Yes Orpah Greek, MD     Review of systems complete and found to be negative unless listed above in HPI     Physical exam Blood pressure 115/78, pulse 93, height 5' 5.5" (1.664 m), weight 117 lb (53.071 kg). General: NAD Neck: No JVD, no thyromegaly or thyroid nodule.  Lungs: Clear to auscultation bilaterally with normal respiratory effort. CV: Nondisplaced PMI. Regular rate and rhythm, normal S1/S2, no S3/S4, soft I/VI pansystolic murmur over LUSB.  No peripheral edema.  No carotid bruit.  Abdomen: Soft, nontender, no hepatosplenomegaly, no distention.  Skin: Intact without lesions or rashes.  Neurologic: Alert and oriented.  Psych: Normal affect. Extremities: No clubbing or cyanosis.  HEENT: Normal.   ECG: Most recent ECG reviewed.  Labs:   Lab Results  Component Value Date   WBC 3.6* 12/30/2009   HGB 12.5 12/30/2009   HCT 37.0 12/30/2009   MCV 91.6 12/30/2009   PLT 116* 12/30/2009   No results found for this basename: NA, K, CL, CO2, BUN, CREATININE, CALCIUM, LABALBU, PROT, BILITOT, ALKPHOS, ALT, AST, GLUCOSE,  in the last 168 hours No results found for this basename: CKTOTAL, CKMB, CKMBINDEX, TROPONINI    No results found for this basename: CHOL   No results found for this basename: HDL   No results found for this basename: LDLCALC   No results found for this basename:  TRIG   No results found for this basename: CHOLHDL   No results found for this basename: LDLDIRECT         Studies: No results found.  ASSESSMENT AND PLAN:  1. Abnormal ECG/preoperative risk stratification: She appears to have a rate related right bundle branch block. ECG performed at [facility] was at a heart rate of 84 bpm and demonstrated sinus rhythm with PVCs. ECG in our office demonstrated sinus rhythm with a right bundle branch block and an isolated PVC, heart rate 94 bpm. The finding of "old possible anteroseptal infarct" is nonspecific. She denies exertional symptoms altogether and given the fact that she can climb 16 steps 4 times daily without anginal symptoms portends  a favorable prognosis. Her physical exam is relatively unremarkable. I think she can safely proceed with the planned surgery scheduled for 03/04/2014. No noninvasive testing is required at this time. 2. Hyperlipidemia: Continue Lipitor 10 mg daily.  Dispo: f/u prn.  Signed: Kate Sable, M.D., F.A.C.C.  03/03/2014, 5:11 PM

## 2014-03-03 NOTE — Patient Instructions (Signed)
Continue all current medications. Follow up as needed  

## 2014-07-13 DIAGNOSIS — L989 Disorder of the skin and subcutaneous tissue, unspecified: Secondary | ICD-10-CM | POA: Insufficient documentation

## 2014-08-02 ENCOUNTER — Encounter (HOSPITAL_COMMUNITY): Payer: Self-pay | Admitting: Emergency Medicine

## 2014-08-02 ENCOUNTER — Emergency Department (HOSPITAL_COMMUNITY)
Admission: EM | Admit: 2014-08-02 | Discharge: 2014-08-02 | Disposition: A | Discharge status: Home / Self Care | Payer: Medicare Other | Attending: Emergency Medicine | Admitting: Emergency Medicine

## 2014-08-02 DIAGNOSIS — M545 Low back pain, unspecified: Secondary | ICD-10-CM

## 2014-08-02 DIAGNOSIS — Z87828 Personal history of other (healed) physical injury and trauma: Secondary | ICD-10-CM | POA: Insufficient documentation

## 2014-08-02 DIAGNOSIS — Z792 Long term (current) use of antibiotics: Secondary | ICD-10-CM | POA: Diagnosis not present

## 2014-08-02 DIAGNOSIS — Z7982 Long term (current) use of aspirin: Secondary | ICD-10-CM | POA: Diagnosis not present

## 2014-08-02 DIAGNOSIS — Z79899 Other long term (current) drug therapy: Secondary | ICD-10-CM | POA: Insufficient documentation

## 2014-08-02 DIAGNOSIS — Z880 Allergy status to penicillin: Secondary | ICD-10-CM | POA: Diagnosis not present

## 2014-08-02 DIAGNOSIS — Z853 Personal history of malignant neoplasm of breast: Secondary | ICD-10-CM | POA: Diagnosis not present

## 2014-08-02 DIAGNOSIS — M549 Dorsalgia, unspecified: Secondary | ICD-10-CM | POA: Diagnosis present

## 2014-08-02 MED ORDER — TRAMADOL HCL 50 MG PO TABS: 50.0000 mg | ORAL_TABLET | Freq: Four times a day (QID) | ORAL | Status: DC | PRN

## 2014-08-02 MED ORDER — HYDROCODONE-ACETAMINOPHEN 5-325 MG PO TABS: 1.0000 | ORAL_TABLET | Freq: Four times a day (QID) | ORAL | Status: DC | PRN

## 2014-08-02 NOTE — Discharge Instructions (Signed)
Rest and take the pain medicine as needed. Tramadol milder hydrocodone is stronger. Make an appoint to follow-up with your doctor in the next few days. Return for any new or worse symptoms.

## 2014-08-02 NOTE — ED Notes (Signed)
Patient complaining of back pain for a few days. Reports history of rib fractures last year and states has had back pain since then.

## 2014-08-02 NOTE — ED Provider Notes (Signed)
CSN: [id number]     Arrival date & time 08/02/14  1211 History   First MD Initiated Contact with Patient 08/02/14 1238     Chief Complaint  Patient presents with  . Back Pain     (Consider location/radiation/quality/duration/timing/severity/associated sxs/prior Treatment) Patient is a 79 y.o. female presenting with back pain. The history is provided by the patient.  Back Pain Associated symptoms: no abdominal pain, no chest pain, no dysuria, no fever and no headaches    patient without any recent fall or trauma. Complaining of back pain  After picking up some boxes 2 days ago. Patient said trouble in this area on and off since she had a fall with rib fractures a year ago. Patient states that the back pain is 8 out of 10 made worse by moving better by rest does not radiate into her leg. No numbness or weakness to her leg. No abdominal pain no dysuria.  Past Medical History  Diagnosis Date  . Cancer     breast cancer   Past Surgical History  Procedure Laterality Date  . Breast surgery      double mastectomy  . Colonoscopy w/ polypectomy     History reviewed. No pertinent family history. History  Substance Use Topics  . Smoking status: Never Smoker   . Smokeless tobacco: Never Used  . Alcohol Use: No   OB History    No data available     Review of Systems  Constitutional: Negative for fever.  HENT: Negative for congestion.   Eyes: Negative for visual disturbance.  Respiratory: Negative for shortness of breath.   Cardiovascular: Negative for chest pain.  Gastrointestinal: Negative for abdominal pain.  Genitourinary: Negative for dysuria.  Musculoskeletal: Positive for back pain.  Skin: Negative for rash.  Neurological: Negative for headaches.  Hematological: Does not bruise/bleed easily.  Psychiatric/Behavioral: Negative for confusion.      Allergies  Tramadol; Penicillins; and Tape  Home Medications   Prior to Admission medications   Medication Sig Start  Date End Date Taking? Authorizing Provider  aspirin EC 81 MG tablet Take 81 mg by mouth daily.    Historical Provider, MD  atorvastatin (LIPITOR) 10 MG tablet Take 10 mg by mouth daily. 04/06/13   Historical Provider, MD  busPIRone (BUSPAR) 10 MG tablet Take 10 mg by mouth daily. 12/22/13   Historical Provider, MD  cephALEXin (KEFLEX) 500 MG capsule Take 1 capsule (500 mg total) by mouth 4 (four) times daily. 02/21/14   Orpah Greek, MD  Cholecalciferol (VITAMIN D3) 2000 UNITS TABS Take 1 tablet by mouth daily.    Historical Provider, MD  HYDROcodone-acetaminophen (NORCO/VICODIN) 5-325 MG per tablet Take 1-2 tablets by mouth every 6 (six) hours as needed. 08/02/14   Fredia Sorrow, MD  letrozole [facility]) 2.5 MG tablet Take 2.5 mg by mouth daily. 04/06/13   Historical Provider, MD  potassium chloride SA (K-DUR,KLOR-CON) 20 MEQ tablet Take 40 mEq by mouth daily.    Historical Provider, MD  traMADol (ULTRAM) 50 MG tablet Take 1 tablet (50 mg total) by mouth every 6 (six) hours as needed. 02/21/14   Orpah Greek, MD  traMADol (ULTRAM) 50 MG tablet Take 1 tablet (50 mg total) by mouth every 6 (six) hours as needed. 08/02/14   Fredia Sorrow, MD   BP 180/72 mmHg  Pulse 86  Temp(Src) 98.3 F (36.8 C) (Oral)  Resp 18  Ht 5\' 5"  (1.651 m)  Wt 130 lb (58.968 kg)  BMI 21.63 kg/m2  SpO2 98% Physical Exam  Constitutional: She is oriented to person, place, and time. She appears well-developed and well-nourished. No distress.  HENT:  Head: Normocephalic and atraumatic.  Mouth/Throat: Oropharynx is clear and moist.  Eyes: Conjunctivae and EOM are normal. Pupils are equal, round, and reactive to light.  Neck: Normal range of motion.  Cardiovascular: Normal rate, regular rhythm and normal heart sounds.   No murmur heard. Pulmonary/Chest: Effort normal and breath sounds normal. No respiratory distress.  Abdominal: Soft. Bowel sounds are normal. There is no tenderness.  Musculoskeletal:  Normal range of motion. She exhibits no edema.  Mild tenderness to palpation to the left lower back no muscle spasm. No focal neuro deficits to the left leg.  Neurological: She is alert and oriented to person, place, and time. No cranial nerve deficit. She exhibits normal muscle tone. Coordination normal.  Skin: Skin is warm. No rash noted.  Nursing note and vitals reviewed.   ED Course  Procedures (including critical care time) Labs Review Labs Reviewed - No data to display  Imaging Review No results found.   EKG Interpretation None      MDM   Final diagnoses:  Left-sided low back pain without sciatica    Patient with complaint of left lower back pain without radiation into the leg and no neuro focal deficits. Patient said trouble in that area on and off for the past several months following a fall that resulted in some rib fractures. No recent trauma. Patient not clear whether tramadol or hydrocodone causes the nausea and vomiting problem so prescribed both. Patient has regular doctor to follow-up with and encouraged to do so. Patient nontoxic no acute distress.    Fredia Sorrow, MD 08/02/14 [id number]

## 2014-08-04 ENCOUNTER — Emergency Department (HOSPITAL_COMMUNITY): Payer: Medicare Other

## 2014-08-04 ENCOUNTER — Encounter (HOSPITAL_COMMUNITY): Payer: Self-pay | Admitting: Emergency Medicine

## 2014-08-04 ENCOUNTER — Emergency Department (HOSPITAL_COMMUNITY)
Admission: EM | Admit: 2014-08-04 | Discharge: 2014-08-04 | Disposition: A | Discharge status: Home / Self Care | Payer: Medicare Other | Attending: Emergency Medicine | Admitting: Emergency Medicine

## 2014-08-04 DIAGNOSIS — M545 Low back pain, unspecified: Secondary | ICD-10-CM

## 2014-08-04 DIAGNOSIS — Z853 Personal history of malignant neoplasm of breast: Secondary | ICD-10-CM | POA: Diagnosis not present

## 2014-08-04 DIAGNOSIS — Z79899 Other long term (current) drug therapy: Secondary | ICD-10-CM | POA: Insufficient documentation

## 2014-08-04 DIAGNOSIS — G8929 Other chronic pain: Secondary | ICD-10-CM | POA: Diagnosis not present

## 2014-08-04 DIAGNOSIS — Z9889 Other specified postprocedural states: Secondary | ICD-10-CM | POA: Insufficient documentation

## 2014-08-04 DIAGNOSIS — Z7982 Long term (current) use of aspirin: Secondary | ICD-10-CM | POA: Diagnosis not present

## 2014-08-04 DIAGNOSIS — Z880 Allergy status to penicillin: Secondary | ICD-10-CM | POA: Insufficient documentation

## 2014-08-04 HISTORY — DX: Other intervertebral disc degeneration, lumbar region: M51.36

## 2014-08-04 MED ORDER — HYDROCODONE-ACETAMINOPHEN 5-325 MG PO TABS: ORAL_TABLET | ORAL | Status: DC

## 2014-08-04 MED ORDER — HYDROCODONE-ACETAMINOPHEN 5-325 MG PO TABS
1.0000 | ORAL_TABLET | Freq: Once | ORAL | Status: AC
  Administered 2014-08-04: 1 via ORAL
  Filled 2014-08-04: qty 1

## 2014-08-04 NOTE — ED Notes (Signed)
Patient with no complaints at this time. Respirations even and unlabored. Skin warm/dry. Discharge instructions reviewed with patient at this time. Patient given opportunity to voice concerns/ask questions. Patient discharged at this time and left Emergency Department with steady gait.   

## 2014-08-04 NOTE — Discharge Instructions (Signed)
°Emergency Department Resource Guide °1) Find a Doctor and Pay Out of Pocket °Although you won't have to find out who is covered by your insurance plan, it is a good idea to ask around and get recommendations. You will then need to call the office and see if the doctor you have chosen will accept you as a new patient and what types of options they offer for patients who are self-pay. Some doctors offer discounts or will set up payment plans for their patients who do not have insurance, but you will need to ask so you aren't surprised when you get to your appointment. ° °2) Contact Your Local Health Department °Not all health departments have doctors that can see patients for sick visits, but many do, so it is worth a call to see if yours does. If you don't know where your local health department is, you can check in your phone book. The CDC also has a tool to help you locate your state's health department, and many state websites also have listings of all of their local health departments. ° °3) Find a Walk-in Clinic °If your illness is not likely to be very severe or complicated, you may want to try a walk in clinic. These are popping up all over the country in pharmacies, drugstores, and shopping centers. They're usually staffed by nurse practitioners or physician assistants that have been trained to treat common illnesses and complaints. They're usually fairly quick and inexpensive. However, if you have serious medical issues or chronic medical problems, these are probably not your best option. ° °No Primary Care Doctor: °- Call [facility] at  832-8000 - they can help you locate a primary care doctor that  accepts your insurance, provides certain services, etc. °- Physician Referral Service- 1-800-533-3463 ° °Chronic Pain Problems: °Organization         Address  Phone   Notes  °[facility]  (336) 297-2271 Patients need to be referred by their primary care doctor.  ° °Medication  Assistance: °Organization         Address  Phone   Notes  °[place] [facility] [address] °[place], [place] [address] (336) 641-8030 --Must be a resident of [place] °-- Must have NO insurance coverage whatsoever (no Medicaid/ Medicare, etc.) °-- The pt. MUST have a primary care doctor that directs their care regularly and follows them in the community °  °[facility]  (866) 331-1348   °[employer]  (888) 892-1162   ° °Agencies that provide inexpensive medical care: °Organization         Address  Phone   Notes  °[facility]  (336) 832-8035   °[facility]    (336) 832-7272   °[facility] [address] °[place], [place] [address] (336) 832-4777   °[facility] [address], °[place] (336) 271-4999   °[facility]    (336) 373-0678   °[facility]    (336) 272-1050   °[facility] ° [address], [place] Phone:  (336) 832-4444, Fax:  (336) 832-4440 Hours of Operation:  9 am - 6 pm, M-F.  Also accepts Medicaid/Medicare and self-pay.  °[facility] ° [address], [place] Phone: (336) 832-3150, Fax: (336) 832-3151. Hours of Operation:  8:30 am - 5:30 pm, M-F.  Also accepts Medicaid and self-pay.  °[facility] [place] [address], [place] Phone: (336) 878-6027   °[facility] [address], [place], [place] (336)723-1848, Ext. 123 Mondays & Thursdays: 7-9 AM.  First 15 patients are seen on a first come, first serve basis. °  ° °Medicaid-accepting [place] Providers: ° °Organization         Address  Phone   Notes  °[facility] [address], [place] (336) 641-2100 Also accepts self-pay patients.  °[facility] [address], [place] ° (336) 856-9996   °[facility] [address], [place]  (336) 288-8857   °[facility] [address] [place] [address], [place] (336) 299-7000   °Veita Bland [address], [place]  ° (336) 373-1557 Only accepts [place] Access Medicaid patients after they have their name applied to their card.  ° °Self-Pay (no insurance) in [place]: ° °Organization         Address  Phone   Notes  °Sickle Cell Patients, [facility] [address], [place] (336) 832-1970   °[facility] [address], [place] (336) 832-4400   °[facility] [place] ° [address], [place] (336) 992-4800   °[facility]/Dr. Osei-Bonsu ° [address] [place] [address], [place] or [address], [place] (336) 841-8500 Phone number for both [place] and [place] locations is the same.  °[facility] [address], [place] (336) 299-0000   °[facility] [address] [place] [address], [place] or [address] (336) 852-7530 °(336) 878-2260   °[facility] [address], [place] (336) 350-1642, phone; (336) 294-5005, fax Sees patients 1st and 3rd Saturday of every month.  Must not qualify for public or private insurance (i.e. Medicaid, Medicare, [place] Health Choice, Veterans' Benefits) • Household income should be no more than 200% of the poverty level •The clinic cannot treat you if you are pregnant or think you are pregnant • Sexually transmitted diseases are not treated at the clinic.  ° ° °Dental Care: °Organization         Address  Phone  Notes  °[place] [facility] [address], [place] (336) 641-6152 Accepts children up to age 21 who are enrolled in Medicaid or [place] Health Choice; pregnant women with a Medicaid card; and children who have applied for Medicaid or [place] Health Choice, but were declined, whose parents can pay a reduced fee at time of service.  °[place]  [facility] [place]  [address], [place] (336) 641-7733 Accepts children up to age 21 who are enrolled in Medicaid or [place] Health Choice; pregnant women with a Medicaid card; and children who have applied for Medicaid or [place] Health Choice, but were declined, whose parents can pay a reduced fee at time of service.  °[facility] ° [address],  (336) 641-4533 Patients are seen by appointment only. Walk-ins are not accepted. [facility] will see patients 18 years of age and older. °Monday - Tuesday (8am-5pm) °Most Wednesdays (8:30-5pm) °$30 per visit, cash only  °[facility] ° [address], [place] (336) 641-4533 Patients are seen by appointment only. Walk-ins are not accepted. [facility] will see patients 18 years of age and older. °One   Wednesday Evening (Monthly: Volunteer Based).  $30 per visit, cash only  °[facility]  (919) 537-3737 for adults; Children under age 4, call Graduate Pediatric Dentistry at (919) [phone]. Children aged 4-14, please call (919) 537-3737 to request a pediatric application. ° Dental services are provided in all areas of dental care including fillings, crowns and bridges, complete and partial dentures, implants, gum treatment, root canals, and extractions. Preventive care is also provided. Treatment is provided to both adults and children. °Patients are selected via a lottery and there is often a waiting list. °  °[facility] [address], °[place] ° (336) 763-8833 [email or web address] °  °[facility] [address], [place], [place] (336)723-1848, Ext. 123 Second and Fourth Thursday of each month, opens at 6:30 AM; Clinic ends at 9 AM.  Patients are seen on a first-come first-served basis, and a limited number are seen during each clinic.  ° °[facility] ° [address], [place], [place] (336) 723-7904    Eligibility Requirements °You must have lived in [place], [place], or [place] counties for at least the last three months. °  You cannot be eligible for state or federal sponsored healthcare insurance, including Veterans Administration, Medicaid, or Medicare. °  You generally cannot be eligible for healthcare insurance through your employer.  °  How to apply: °Eligibility screenings are held every Tuesday and Wednesday afternoon from 1:00 pm until 4:00 pm. You do not need an appointment for the interview!  °[facility] [address], [place], [place] 336-631-2330   °[facility] [facility]  336-342-8273   °[facility]  336-703-3100   °[facility]  336-570-6415   ° °Behavioral Health Resources in the Community: °Intensive Outpatient Programs °Organization         Address  Phone  Notes  °[facility] [address], [place], [place] 336-878-6098   °[facility] [address], [place], [place] 336-832-9800   °[facility] [address], [place], [place] ° 336-882-2125   °[facility] [address],  °[place], [place] 1-800-853-5163 or 336-641-4981   °Substance Abuse Resources °Organization         Address  Phone  Notes  °[facility]  336-882-2125   °[facility]  336-784-9470   °[facility]  336-285-9073   °[facility]  336-845-3988   °Residential & Outpatient Substance Abuse Program  1-800-659-3381   °Psychological Services °Organization         Address  Phone  Notes  °[facility]  336- 832-9600   °[facility]  336- 378-7881   °[facility] [address], [place] 1-800-853-5163 or 336-641-4981   ° °Mobile Crisis Teams °Organization         Address  Phone  Notes  °[facility], Mobile Crisis Care Unit  1-877-626-1772   °Assertive °[facility] ° [address]  [place], [place] 336-834-9664   °Sharon DeEsch [address] °[place] 336-554-5454   ° °Self-Help/Support Groups °Organization         Address  Phone             Notes  °[facility]  - variety of support groups  336- 373-1402 Call for more information  °Narcotics Anonymous (NA), [facility] [address], °[place]  2 meetings at this location  ° °  Residential Treatment Programs °Organization         Address  Phone  Notes  °[facility] [address],    °[place]  1-866-801-8205   °[facility] ° [address], [place], [place] 704-293-8524   °[facility] [address], [place] 336-845-3988 Admissions: 8am-3pm M-F  °[facility] [address]    °[place], [place] 336-841-1104   °[facility] [address], [place], [place] 336-379-7146   °[facility] [address]  °[place], [place] 336-285-9073   °[facility] Intensive Outpatient [address], [place], [place] 336-852-3033   °[facility] [address]  °[place], [place] 1-877-615-2722 or 336-784-9470   °[facility] ([facility] [address] [place], [place] 336-227-7417 Accepts Medicaid  °[facility] [address]  °[place] 1-800-659-3381 Substance Abuse/Addiction Treatment  ° °[place] Behavioral Health Resources °Organization         Address  Phone  Notes  °[facility]  (888) 581-9988   °Julie Brannon, PhD [address] [place], [place]   (336) 349-5553 or (336) 951-0000   °[facility]   [address] °[place], [place] (336) 349-4454   °[facility] [address], [place], [place] (336) 342-8316 Insurance/Medicaid/sponsorship through [facility]  °[facility] [address]                                    [place], [place] (336) 342-8316 Therapy/tele-psych/case    °[facility] [address]  ° [place], [place] (336) 349-2233    °Dr. Arfeen  (336) 349-4544   °[facility] [place]  [employer] [place] [facility] 1) [address], [place] °2) [address], [place] °3)  [address], [place] (336) 349-3220 °(336) 342-7768 ° °(336) 342-8140   °[place] Child Abuse [facility] (336) 342-1394 or (336) 342-3537 (After Hours)    ° ° °Take the prescription as directed.  Apply moist heat or ice to the area(s) of discomfort, for 15 minutes at a time, several times per day for the next few days.  Do not fall asleep on a heating or ice pack.  Call your regular medical doctor tomorrow to schedule a follow up appointment in the next 2 days.  Return to the Emergency Department immediately if worsening. ° °

## 2014-08-04 NOTE — ED Provider Notes (Signed)
CSN: [id number]     Arrival date & time 08/04/14  1507 History   First MD Initiated Contact with Patient 08/04/14 1604     Chief Complaint  Patient presents with  . Back Pain      HPI Pt was seen at 1720. Per pt, c/o gradual onset and persistence of constant acute flair of her chronic low back "pain" for the past 4 days. Pt states the pain began when she "bent over to pick up a box." Pain worsens with palpation of the area and body position changes. Denies incont/retention of bowel or bladder, no saddle anesthesia, no focal motor weakness, no tingling/numbness in extremities, no fevers, no direct injury, no abd pain.   The symptoms have been associated with no other complaints.The patient has a significant history of similar symptoms previously, recently being evaluated for this complaint and multiple prior evals for same.  Pt was evaluated for this complaint 2 days ago and "ran out of vicodin." Family and pt are requesting "another rx."    Past Medical History  Diagnosis Date  . Cancer     breast cancer  . DDD (degenerative disc disease), lumbar    Past Surgical History  Procedure Laterality Date  . Breast surgery      double mastectomy  . Colonoscopy w/ polypectomy     Family History  Problem Relation Age of Onset  . Cancer Sister    History  Substance Use Topics  . Smoking status: Never Smoker   . Smokeless tobacco: Never Used  . Alcohol Use: No   OB History    Gravida Para Term Preterm AB TAB SAB Ectopic Multiple Living   1 1 1       1      Review of Systems ROS: Statement: All systems negative except as marked or noted in the HPI; Constitutional: Negative for fever and chills. ; ; Eyes: Negative for eye pain, redness and discharge. ; ; ENMT: Negative for ear pain, hoarseness, nasal congestion, sinus pressure and sore throat. ; ; Cardiovascular: Negative for chest pain, palpitations, diaphoresis, dyspnea and peripheral edema. ; ; Respiratory: Negative for cough, wheezing  and stridor. ; ; Gastrointestinal: Negative for nausea, vomiting, diarrhea, abdominal pain, blood in stool, hematemesis, jaundice and rectal bleeding. . ; ; Genitourinary: Negative for dysuria, flank pain and hematuria. ; ; Musculoskeletal: +LBP. Negative for neck pain. Negative for swelling and trauma.; ; Skin: Negative for pruritus, rash, abrasions, blisters, bruising and skin lesion.; ; Neuro: Negative for headache, lightheadedness and neck stiffness. Negative for weakness, altered level of consciousness , altered mental status, extremity weakness, paresthesias, involuntary movement, seizure and syncope.      Allergies  Tramadol; Penicillins; and Tape  Home Medications   Prior to Admission medications   Medication Sig Start Date End Date Taking? Authorizing Provider  aspirin EC 81 MG tablet Take 81 mg by mouth daily.    Historical Provider, MD  atorvastatin (LIPITOR) 10 MG tablet Take 10 mg by mouth daily. 04/06/13   Historical Provider, MD  busPIRone (BUSPAR) 10 MG tablet Take 10 mg by mouth daily. 12/22/13   Historical Provider, MD  cephALEXin (KEFLEX) 500 MG capsule Take 1 capsule (500 mg total) by mouth 4 (four) times daily. 02/21/14   Orpah Greek, MD  Cholecalciferol (VITAMIN D3) 2000 UNITS TABS Take 1 tablet by mouth daily.    Historical Provider, MD  HYDROcodone-acetaminophen (NORCO/VICODIN) 5-325 MG per tablet Take 1-2 tablets by mouth every 6 (six) hours as needed.  08/02/14   Fredia Sorrow, MD  letrozole [facility]) 2.5 MG tablet Take 2.5 mg by mouth daily. 04/06/13   Historical Provider, MD  potassium chloride SA (K-DUR,KLOR-CON) 20 MEQ tablet Take 40 mEq by mouth daily.    Historical Provider, MD  traMADol (ULTRAM) 50 MG tablet Take 1 tablet (50 mg total) by mouth every 6 (six) hours as needed. 02/21/14   Orpah Greek, MD  traMADol (ULTRAM) 50 MG tablet Take 1 tablet (50 mg total) by mouth every 6 (six) hours as needed. 08/02/14   Fredia Sorrow, MD   BP 161/95  mmHg  Pulse 93  Temp(Src) 98.3 F (36.8 C) (Oral)  Resp 20  Ht 5\' 5"  (1.651 m)  Wt 130 lb (58.968 kg)  BMI 21.63 kg/m2  SpO2 95% Physical Exam  1725: Physical examination:  Nursing notes reviewed; Vital signs and O2 SAT reviewed;  Constitutional: Well developed, Well nourished, Well hydrated, In no acute distress; Head:  Normocephalic, atraumatic; Eyes: EOMI, PERRL, No scleral icterus; ENMT: Mouth and pharynx normal, Mucous membranes moist; Neck: Supple, Full range of motion, No lymphadenopathy; Cardiovascular: Regular rate and rhythm, No gallop; Respiratory: Breath sounds clear & equal bilaterally, No wheezes.  Speaking full sentences with ease, Normal respiratory effort/excursion; Chest: Nontender, Movement normal; Abdomen: Soft, Nontender, Nondistended, Normal bowel sounds; Genitourinary: No CVA tenderness; Spine:  No midline CS, TS, LS tenderness. +TTP left lumbar paraspinal muscles. No rash.;; Extremities: Pulses normal, No tenderness, No edema, No calf edema or asymmetry.; Neuro: AA&Ox3, Major CN grossly intact.  Speech clear. No gross focal motor or sensory deficits in extremities. Strength 5/5 equal bilat UE's and LE's, including great toe dorsiflexion.  DTR 2/4 equal bilat UE's and LE's.  No gross sensory deficits.  Neg straight leg raises bilat.; Skin: Color normal, Warm, Dry.   ED Course  Procedures     EKG Interpretation None      MDM  MDM Reviewed: previous chart, nursing note and vitals Reviewed previous: x-ray Interpretation: x-ray     Dg Lumbar Spine Complete 08/04/2014   CLINICAL DATA:  Left lumbar spine pain radiating into the left lower extremity. Pain began after lifting boxes 5 days prior.  EXAM: LUMBAR SPINE - COMPLETE 4+ VIEW  COMPARISON:  Radiographs 02/21/2014  FINDINGS: The alignment is maintained. Vertebral body heights are normal. There is no listhesis. The posterior elements are intact. Minimal disc space narrowing at L2-L3 with associated endplate spurs,  progressed from prior. The disc spaces are otherwise preserved. Facet arthropathy at L4-L5 and L5-S1. No fracture. Sacroiliac joints are symmetric and normal. Atherosclerosis again noted of the abdominal aorta.  IMPRESSION: 1. No acute fracture or bony abnormality. 2. Mild progressive degenerative disc disease at L2-L3 from prior exam. Facet arthropathy is grossly stable.   Electronically Signed   By: Jeb Levering M.D.   On: 08/04/2014 18:04    1822:  XR without acute fx. Pt and family reassured. Hydrocodone given for pain with improvement. Pt and family want to go home now. Dx and testing d/w pt and family.  Questions answered.  Verb understanding, agreeable to d/c home with outpt f/u.   Francine Graven, DO 08/06/14 1350

## 2014-08-04 NOTE — ED Notes (Signed)
Patient c/o left lower back pain with spasm. Patient lifting boxes on Friday. Per family patient was seen here Sunday for back pain and was given tramadol despite telling doctor she could not tolerate it. Patient also given vicodin with some relief. Per family member patient has been "sick to stomach when she takes the tramadol" and back pain is not improving. Denies any urinary symptoms or problems with BMs.

## 2015-01-05 ENCOUNTER — Other Ambulatory Visit: Payer: Self-pay | Admitting: *Deleted

## 2015-01-05 NOTE — Patient Outreach (Signed)
01/05/15-Telephone call for screening, no answer to telephone, received message that states " not accepting calls at this time"   Will attempt to reach pt again next week.  Jacqlyn Larsen [facility], [place] Coordinator [phone]

## 2015-01-05 NOTE — Patient Outreach (Signed)
[facility]) Care Management  01/05/2015  Paige Guerrero 25-Feb-1936 [id number]   Referral from MD, assigned Jacqlyn Larsen, RN to outreach.  Thanks, Ronnell Freshwater. [place], [place] Assistant Phone: [phone] Fax: [phone]

## 2015-01-12 ENCOUNTER — Other Ambulatory Visit: Payer: Self-pay | Admitting: *Deleted

## 2015-01-12 ENCOUNTER — Encounter: Payer: Self-pay | Admitting: *Deleted

## 2015-01-12 NOTE — Patient Outreach (Signed)
01/12/15- Second outreach attempt, telephone call to patient and received voicemail stating "not accepting calls at this time", RN CM mailed letter to patient's home requesting that pt contact [facility] RN [place] [facility], [place] Coordinator [phone]

## 2015-01-20 DIAGNOSIS — D696 Thrombocytopenia, unspecified: Secondary | ICD-10-CM | POA: Insufficient documentation

## 2015-01-22 ENCOUNTER — Other Ambulatory Visit: Payer: Self-pay | Admitting: *Deleted

## 2015-01-22 NOTE — Patient Outreach (Signed)
01/22/15- Telephone call third outreach attempt for screening, no answer to telephone, received message stating "not accepting calls at this time".  Letter had previously been mailed to patient's home requesting return phone call and pt did not respond.  RN CM faxed letter to primary MD Dr. Quillian Quince informing of case closure.  Jacqlyn Larsen [facility], [place] Coordinator [phone]

## 2015-01-28 NOTE — Patient Outreach (Signed)
[facility]) Care Management  01/28/2015  Paige Guerrero 09/30/1935 [id number]   Notification from Jacqlyn Larsen, RN to close case due to unable to contact patient for [place] Management services.  Thanks, Ronnell Freshwater. [place], [place] Assistant Phone: [phone] Fax: [phone]

## 2015-06-09 ENCOUNTER — Emergency Department (HOSPITAL_COMMUNITY)
Admission: EM | Admit: 2015-06-09 | Discharge: 2015-06-09 | Disposition: A | Discharge status: Home / Self Care | Payer: Medicare Other | Attending: Emergency Medicine | Admitting: Emergency Medicine

## 2015-06-09 ENCOUNTER — Encounter (HOSPITAL_COMMUNITY): Payer: Self-pay | Admitting: Emergency Medicine

## 2015-06-09 DIAGNOSIS — M5136 Other intervertebral disc degeneration, lumbar region: Secondary | ICD-10-CM | POA: Diagnosis not present

## 2015-06-09 DIAGNOSIS — Z7982 Long term (current) use of aspirin: Secondary | ICD-10-CM | POA: Insufficient documentation

## 2015-06-09 DIAGNOSIS — Z792 Long term (current) use of antibiotics: Secondary | ICD-10-CM | POA: Insufficient documentation

## 2015-06-09 DIAGNOSIS — Z853 Personal history of malignant neoplasm of breast: Secondary | ICD-10-CM | POA: Diagnosis not present

## 2015-06-09 DIAGNOSIS — M549 Dorsalgia, unspecified: Secondary | ICD-10-CM

## 2015-06-09 DIAGNOSIS — M545 Low back pain: Secondary | ICD-10-CM | POA: Insufficient documentation

## 2015-06-09 DIAGNOSIS — Z880 Allergy status to penicillin: Secondary | ICD-10-CM | POA: Insufficient documentation

## 2015-06-09 DIAGNOSIS — Z79899 Other long term (current) drug therapy: Secondary | ICD-10-CM | POA: Insufficient documentation

## 2015-06-09 MED ORDER — OXYCODONE-ACETAMINOPHEN 5-325 MG PO TABS
1.0000 | ORAL_TABLET | Freq: Once | ORAL | Status: AC
  Administered 2015-06-09: 1 via ORAL
  Filled 2015-06-09: qty 1

## 2015-06-09 MED ORDER — OXYCODONE-ACETAMINOPHEN 5-325 MG PO TABS: 1.0000 | ORAL_TABLET | ORAL | Status: DC | PRN

## 2015-06-09 NOTE — Discharge Instructions (Signed)
Take the prescribed medication as directed.  Use caution, it can make you sleepy and/or drowsy. Follow-up with your primary care physician. Return to the ED for new or worsening symptoms.

## 2015-06-09 NOTE — ED Notes (Signed)
Pt reports LT lower back pain x 1 week. Pt denies recent injury or fall. Reports pain increases with movement. Pt ambulatory. Denies urinary symptoms.

## 2015-06-09 NOTE — ED Provider Notes (Signed)
CSN: [id number]     Arrival date & time 06/09/15  1205 History   First MD Initiated Contact with Patient 06/09/15 1217     Chief Complaint  Patient presents with  . Back Pain     (Consider location/radiation/quality/duration/timing/severity/associated sxs/prior Treatment) Patient is a 80 y.o. female presenting with back pain. The history is provided by the patient and medical records.  Back Pain  80 y.o. F with hx of breast cancer, DDD of lumbar spine, presenting to the ED for left sided back pain.  No new injuries, trauma, or falls. Patient states she has had intermittent issues with her left lower back since she broke her left ribs a few years ago.  She states it began hurting a few days ago.  She usually walks for exercise daily as she does not like to sit still according to her granddaughter.  She states since walking daily pain has slightly worsening.  She denies any numbness or weakness of her legs.  No bowel or bladder incontinence.  Patient has walker at home if needed, however she ambulates independently most of the time.  No fever, chills, sweats, or recent illness.  No abdominal pain, chest pain, or SOB. VSS.  Past Medical History  Diagnosis Date  . Cancer [facility])     breast cancer  . DDD (degenerative disc disease), lumbar    Past Surgical History  Procedure Laterality Date  . Breast surgery      double mastectomy  . Colonoscopy w/ polypectomy     Family History  Problem Relation Age of Onset  . Cancer Sister    Social History  Substance Use Topics  . Smoking status: Never Smoker   . Smokeless tobacco: Never Used  . Alcohol Use: No   OB History    Gravida Para Term Preterm AB TAB SAB Ectopic Multiple Living   1 1 1       1      Review of Systems  Musculoskeletal: Positive for back pain.  All other systems reviewed and are negative.     Allergies  Tramadol; Penicillins; and Tape  Home Medications   Prior to Admission medications   Medication Sig Start  Date End Date Taking? Authorizing Provider  aspirin EC 81 MG tablet Take 81 mg by mouth daily.    Historical Provider, MD  atorvastatin (LIPITOR) 10 MG tablet Take 10 mg by mouth daily. 04/06/13   Historical Provider, MD  busPIRone (BUSPAR) 10 MG tablet Take 10 mg by mouth daily. 12/22/13   Historical Provider, MD  cephALEXin (KEFLEX) 500 MG capsule Take 1 capsule (500 mg total) by mouth 4 (four) times daily. 02/21/14   Orpah Greek, MD  Cholecalciferol (VITAMIN D3) 2000 UNITS TABS Take 1 tablet by mouth daily.    Historical Provider, MD  HYDROcodone-acetaminophen (NORCO/VICODIN) 5-325 MG per tablet 1 tab PO q12 hours prn pain 08/04/14   Francine Graven, DO  letrozole [facility]) 2.5 MG tablet Take 2.5 mg by mouth daily. 04/06/13   Historical Provider, MD  potassium chloride SA (K-DUR,KLOR-CON) 20 MEQ tablet Take 40 mEq by mouth daily.    Historical Provider, MD  traMADol (ULTRAM) 50 MG tablet Take 1 tablet (50 mg total) by mouth every 6 (six) hours as needed. 02/21/14   Orpah Greek, MD  traMADol (ULTRAM) 50 MG tablet Take 1 tablet (50 mg total) by mouth every 6 (six) hours as needed. 08/02/14   Fredia Sorrow, MD   BP 159/59 mmHg  Pulse 100  Temp(Src) 98.4 F (36.9 C) (Oral)  Resp 18  Ht 5' 5.5" (1.664 m)  Wt 64.411 kg  BMI 23.26 kg/m2  SpO2 97%   Physical Exam  Constitutional: She is oriented to person, place, and time. She appears well-developed and well-nourished.  HENT:  Head: Normocephalic and atraumatic.  Mouth/Throat: Oropharynx is clear and moist.  Eyes: Conjunctivae and EOM are normal. Pupils are equal, round, and reactive to light.  Neck: Normal range of motion.  Cardiovascular: Normal rate, regular rhythm and normal heart sounds.   Pulmonary/Chest: Effort normal and breath sounds normal.  Abdominal: Soft. Bowel sounds are normal.  Musculoskeletal: Normal range of motion.  Tenderness of left lumbar paraspinal region; no midline deformities or step-off; full  ROM maintained; no muscle spasm present; - SLR bilaterally; normal strength and sensation of BLE; normal gait  Neurological: She is alert and oriented to person, place, and time.  Skin: Skin is warm and dry.  Psychiatric: She has a normal mood and affect.  Nursing note and vitals reviewed.   ED Course  Procedures (including critical care time) Labs Review Labs Reviewed - No data to display  Imaging Review No results found. I have personally reviewed and evaluated these images and lab results as part of my medical decision-making.   EKG Interpretation None      MDM   Final diagnoses:  Back pain, unspecified location   80 year old female here with left lower back pain. She has history of the same occurring intermittently. Patient is afebrile, nontoxic. She does have tenderness of the left lumbar paraspinal region without midline deformity or step-off. She has normal strength and sensation of bilateral lower extremities with normal gait. She has no bowel or bladder incontinence. No clinical findings today concerning for cauda equina, spinal cord injury, epidural abscess, or other emergent spinal pathology. No abdominal pain to raise concern for AAA. She has had prior LS films which i have reviewed.  Without direct trauma or fall, do not feel repeat imaging indicated today. Suspect this is another exacerbation of her MSK pain Will start on short course percocet as she has tolerated this well in the past per granddaughter at bedside.  Encouraged to follow-up with PCP.  Discussed plan with patient, he/she acknowledged understanding and agreed with plan of care.  Return precautions given for new or worsening symptoms.  Larene Pickett, PA-C 06/09/15 [place], MD 06/11/15 [phone]

## 2015-06-18 ENCOUNTER — Emergency Department (HOSPITAL_COMMUNITY): Payer: Medicare Other

## 2015-06-18 ENCOUNTER — Encounter (HOSPITAL_COMMUNITY): Payer: Self-pay | Admitting: Emergency Medicine

## 2015-06-18 ENCOUNTER — Emergency Department (HOSPITAL_COMMUNITY)
Admission: EM | Admit: 2015-06-18 | Discharge: 2015-06-18 | Disposition: A | Discharge status: Home / Self Care | Payer: Medicare Other | Attending: Emergency Medicine | Admitting: Emergency Medicine

## 2015-06-18 DIAGNOSIS — R52 Pain, unspecified: Secondary | ICD-10-CM | POA: Diagnosis not present

## 2015-06-18 DIAGNOSIS — Z79899 Other long term (current) drug therapy: Secondary | ICD-10-CM | POA: Diagnosis not present

## 2015-06-18 DIAGNOSIS — N390 Urinary tract infection, site not specified: Secondary | ICD-10-CM | POA: Diagnosis not present

## 2015-06-18 DIAGNOSIS — J020 Streptococcal pharyngitis: Secondary | ICD-10-CM | POA: Diagnosis not present

## 2015-06-18 DIAGNOSIS — Z880 Allergy status to penicillin: Secondary | ICD-10-CM | POA: Insufficient documentation

## 2015-06-18 DIAGNOSIS — Z8739 Personal history of other diseases of the musculoskeletal system and connective tissue: Secondary | ICD-10-CM | POA: Insufficient documentation

## 2015-06-18 DIAGNOSIS — R0981 Nasal congestion: Secondary | ICD-10-CM | POA: Insufficient documentation

## 2015-06-18 DIAGNOSIS — Z792 Long term (current) use of antibiotics: Secondary | ICD-10-CM | POA: Insufficient documentation

## 2015-06-18 DIAGNOSIS — R05 Cough: Secondary | ICD-10-CM | POA: Diagnosis present

## 2015-06-18 DIAGNOSIS — Z853 Personal history of malignant neoplasm of breast: Secondary | ICD-10-CM | POA: Insufficient documentation

## 2015-06-18 DIAGNOSIS — Z7982 Long term (current) use of aspirin: Secondary | ICD-10-CM | POA: Diagnosis not present

## 2015-06-18 LAB — URINALYSIS, ROUTINE W REFLEX MICROSCOPIC
BILIRUBIN URINE: NEGATIVE
Glucose, UA: NEGATIVE mg/dL
HGB URINE DIPSTICK: NEGATIVE
Ketones, ur: NEGATIVE mg/dL
NITRITE: POSITIVE — AB
PH: 7 (ref 5.0–8.0)
Protein, ur: NEGATIVE mg/dL
SPECIFIC GRAVITY, URINE: 1.01 (ref 1.005–1.030)

## 2015-06-18 LAB — CBC WITH DIFFERENTIAL/PLATELET
Basophils Absolute: 0 10*3/uL (ref 0.0–0.1)
Basophils Relative: 0 %
EOS ABS: 0 10*3/uL (ref 0.0–0.7)
EOS PCT: 0 %
HCT: 34.5 % — ABNORMAL LOW (ref 36.0–46.0)
Hemoglobin: 11.6 g/dL — ABNORMAL LOW (ref 12.0–15.0)
LYMPHS ABS: 0.8 10*3/uL (ref 0.7–4.0)
LYMPHS PCT: 12 %
MCH: 30.4 pg (ref 26.0–34.0)
MCHC: 33.6 g/dL (ref 30.0–36.0)
MCV: 90.6 fL (ref 78.0–100.0)
MONO ABS: 0.5 10*3/uL (ref 0.1–1.0)
MONOS PCT: 8 %
Neutro Abs: 4.9 10*3/uL (ref 1.7–7.7)
Neutrophils Relative %: 80 %
PLATELETS: 163 10*3/uL (ref 150–400)
RBC: 3.81 MIL/uL — AB (ref 3.87–5.11)
RDW: 12.6 % (ref 11.5–15.5)
WBC: 6.2 10*3/uL (ref 4.0–10.5)

## 2015-06-18 LAB — BASIC METABOLIC PANEL
Anion gap: 9 (ref 5–15)
BUN: 16 mg/dL (ref 6–20)
CO2: 30 mmol/L (ref 22–32)
CREATININE: 0.75 mg/dL (ref 0.44–1.00)
Calcium: 9.3 mg/dL (ref 8.9–10.3)
Chloride: 102 mmol/L (ref 101–111)
GFR calc Af Amer: >60 mL/min (ref >60)
GLUCOSE: 119 mg/dL — AB (ref 65–99)
POTASSIUM: 3.8 mmol/L (ref 3.5–5.1)
SODIUM: 141 mmol/L (ref 135–145)

## 2015-06-18 LAB — HEPATIC FUNCTION PANEL
ALT: 18 U/L (ref 14–54)
AST: 23 U/L (ref 15–41)
Albumin: 4.3 g/dL (ref 3.5–5.0)
Alkaline Phosphatase: 78 U/L (ref 38–126)
BILIRUBIN DIRECT: 0.2 mg/dL (ref 0.1–0.5)
BILIRUBIN INDIRECT: 0.6 mg/dL (ref 0.3–0.9)
BILIRUBIN TOTAL: 0.8 mg/dL (ref 0.3–1.2)
Total Protein: 7 g/dL (ref 6.5–8.1)

## 2015-06-18 LAB — URINE MICROSCOPIC-ADD ON: RBC / HPF: NONE SEEN RBC/hpf (ref 0–5)

## 2015-06-18 LAB — RAPID STREP SCREEN (MED CTR MEBANE ONLY): STREPTOCOCCUS, GROUP A SCREEN (DIRECT): POSITIVE — AB

## 2015-06-18 LAB — LIPASE, BLOOD: LIPASE: 28 U/L (ref 11–51)

## 2015-06-18 LAB — TROPONIN I: Troponin I: <0.03 ng/mL (ref <0.031)

## 2015-06-18 LAB — LACTIC ACID, PLASMA: Lactic Acid, Venous: 0.9 mmol/L (ref 0.5–2.0)

## 2015-06-18 MED ORDER — NITROFURANTOIN MONOHYD MACRO 100 MG PO CAPS: 100.0000 mg | ORAL_CAPSULE | Freq: Two times a day (BID) | ORAL | Status: DC

## 2015-06-18 MED ORDER — CLINDAMYCIN HCL 150 MG PO CAPS: ORAL_CAPSULE | ORAL | Status: DC

## 2015-06-18 NOTE — Discharge Instructions (Signed)
°Emergency Department Resource Guide °1) Find a Doctor and Pay Out of Pocket °Although you won't have to find out who is covered by your insurance plan, it is a good idea to ask around and get recommendations. You will then need to call the office and see if the doctor you have chosen will accept you as a new patient and what types of options they offer for patients who are self-pay. Some doctors offer discounts or will set up payment plans for their patients who do not have insurance, but you will need to ask so you aren't surprised when you get to your appointment. ° °2) Contact Your Local Health Department °Not all health departments have doctors that can see patients for sick visits, but many do, so it is worth a call to see if yours does. If you don't know where your local health department is, you can check in your phone book. The CDC also has a tool to help you locate your state's health department, and many state websites also have listings of all of their local health departments. ° °3) Find a Walk-in Clinic °If your illness is not likely to be very severe or complicated, you may want to try a walk in clinic. These are popping up all over the country in pharmacies, drugstores, and shopping centers. They're usually staffed by nurse practitioners or physician assistants that have been trained to treat common illnesses and complaints. They're usually fairly quick and inexpensive. However, if you have serious medical issues or chronic medical problems, these are probably not your best option. ° °No Primary Care Doctor: °- Call [facility] at  832-8000 - they can help you locate a primary care doctor that  accepts your insurance, provides certain services, etc. °- Physician Referral Service- 1-800-533-3463 ° °Chronic Pain Problems: °Organization         Address  Phone   Notes  °[facility]  (336) 297-2271 Patients need to be referred by their primary care doctor.  ° °Medication  Assistance: °Organization         Address  Phone   Notes  °[place] [facility] [address] °[place], [place] [address] (336) 641-8030 --Must be a resident of [place] °-- Must have NO insurance coverage whatsoever (no Medicaid/ Medicare, etc.) °-- The pt. MUST have a primary care doctor that directs their care regularly and follows them in the community °  °[facility]  (866) 331-1348   °[employer]  (888) 892-1162   ° °Agencies that provide inexpensive medical care: °Organization         Address  Phone   Notes  °[facility]  (336) 832-8035   °[facility]    (336) 832-7272   °[facility] [address] °[place], [place] [address] (336) 832-4777   °[facility] [address], °[place] (336) 271-4999   °[facility]    (336) 373-0678   °[facility]    (336) 272-1050   °[facility] ° [address], [place] Phone:  (336) 832-4444, Fax:  (336) 832-4440 Hours of Operation:  9 am - 6 pm, M-F.  Also accepts Medicaid/Medicare and self-pay.  °[facility] ° [address], [place] Phone: (336) 832-3150, Fax: (336) [phone]. Hours of Operation:  8:30 am - 5:30 pm, M-F.  Also accepts Medicaid and self-pay.  °[facility] [place] [address], [place] Phone: (336) 878-6027   °[facility] [address], [place], [place] (336)723-1848, Ext. 123 Mondays & Thursdays: 7-9 AM.  First 15 patients are seen on a first come, first serve basis. °  ° °Medicaid-accepting [place] Providers: ° °Organization         Address  Phone   Notes  °[facility] [address], [place] (336) 641-2100 Also accepts self-pay patients.  °[facility] [address], [place] ° (336) 856-9996   °[facility] [address], [place]  (336) 288-8857   °[facility] [address] [place] [address], [place] (336) 299-7000   °Veita Bland [address], [place]  ° (336) 373-1557 Only accepts [place] Access Medicaid patients after they have their name applied to their card.  ° °Self-Pay (no insurance) in [place]: ° °Organization         Address  Phone   Notes  °Sickle Cell Patients, [facility] [address], [place] (336) 832-1970   °[facility] [address], [place] (336) 832-4400   °[facility] ° [address], [place] (336) 992-4800   °[facility]/Dr. Osei-Bonsu ° [address] [place] [address], [place] or [address], [place] (336) [phone] Phone number for both [place] and [place] locations is the same.  °[facility] [address], [place] (336) 299-0000   °[facility] [address] [place] [address], [place] or [address] (336) 852-7530 °(336) 878-2260   °[facility] [address], [place] (336) 350-1642, phone; (336) 294-5005, fax Sees patients 1st and 3rd Saturday of every month.  Must not qualify for public or private insurance (i.e. Medicaid, Medicare, [place] Health Choice, Veterans' Benefits) • Household income should be no more than 200% of the poverty level •The clinic cannot treat you if you are pregnant or think you are pregnant • Sexually transmitted diseases are not treated at the clinic.  ° ° °Dental Care: °Organization         Address  Phone  Notes  °[place] [facility] [address], [place] (336) 641-6152 Accepts children up to age 21 who are enrolled in Medicaid or [place] Health Choice; pregnant women with a Medicaid card; and children who have applied for Medicaid or [place] Health Choice, but were declined, whose parents can pay a reduced fee at time of service.  °[place]  [facility] [place]  [address], [place] (336) 641-7733 Accepts children up to age 21 who are enrolled in Medicaid or [place] Health Choice; pregnant women with a Medicaid card; and children who have applied for Medicaid or [place] Health Choice, but were declined, whose parents can pay a reduced fee at time of service.  °[facility] ° [address], [place] (336) 641-4533 Patients are seen by appointment only. Walk-ins are not accepted. [facility] will see patients 18 years of age and older. °Monday - Tuesday (8am-5pm) °Most Wednesdays (8:30-5pm) °$30 per visit, cash only  °[facility] ° [address], [place] (336) 641-4533 Patients are seen by appointment only. Walk-ins are not accepted. [facility] will see patients 18 years of age and older. °One   Wednesday Evening (Monthly: Volunteer Based).  $30 per visit, cash only  °[facility]  (919) 537-3737 for adults; Children under age 4, call Graduate Pediatric Dentistry at (919) [phone]. Children aged 4-14, please call (919) [phone] to request a pediatric application. ° Dental services are provided in all areas of dental care including fillings, crowns and bridges, complete and partial dentures, implants, gum treatment, root canals, and extractions. Preventive care is also provided. Treatment is provided to both adults and children. °Patients are selected via a lottery and there is often a waiting list. °  °[facility] [address], °[place] ° (336) 763-8833 [email or web address] °  °[facility] [address], [place], [place] (336)723-1848, Ext. 123 Second and Fourth Thursday of each month, opens at 6:30 AM; Clinic ends at 9 AM.  Patients are seen on a first-come first-served basis, and a limited number are seen during each clinic.  ° °[facility] ° [address], [place], [place] (336) 723-7904    Eligibility Requirements °You must have lived in [place], [place], or [place] counties for at least the last three months. °  You cannot be eligible for state or federal sponsored healthcare insurance, including Veterans Administration, Medicaid, or Medicare. °  You generally cannot be eligible for healthcare insurance through your employer.  °  How to apply: °Eligibility screenings are held every Tuesday and Wednesday afternoon from 1:00 pm until 4:00 pm. You do not need an appointment for the interview!  °[facility] [address], [place], [place] 336-631-2330   °[place] [facility]  336-342-8273   °[facility]  336-703-3100   °[facility]  336-570-6415   ° °Behavioral Health Resources in the Community: °Intensive Outpatient Programs °Organization         Address  Phone  Notes  °[facility] [address], [place], [place] 336-878-6098   °[facility] [address], [place], [place] 336-832-9800   °[facility] [address], [place], [place] ° 336-882-2125   °[facility] [address],  °[place], [place] 1-800-853-5163 or 336-641-4981   °Substance Abuse Resources °Organization         Address  Phone  Notes  °[facility]  336-882-2125   °[facility]  336-784-9470   °[facility]  336-285-9073   °[facility]  336-845-3988   °Residential & Outpatient Substance Abuse Program  1-800-659-3381   °Psychological Services °Organization         Address  Phone  Notes  ° Health  336- 832-9600   °[facility]  336- 378-7881   °[facility] [address], [place] 1-800-853-5163 or 336-641-4981   ° °Mobile Crisis Teams °Organization         Address  Phone  Notes  °[facility], Mobile Crisis Care Unit  1-877-626-1772   °Assertive °[facility] ° [address]  [place], [place] 336-834-9664   °Sharon DeEsch [address] °[place] 336-554-5454   ° °Self-Help/Support Groups °Organization         Address  Phone             Notes  °[facility] [place] - variety of support groups  336- 373-1402 Call for more information  °Narcotics Anonymous (NA), [facility] [address], °[place]  2 meetings at this location  ° °  Residential Treatment Programs °Organization         Address  Phone  Notes  °[facility] [address],    °[place]  1-866-801-8205   °[facility] ° [address], [place], [place] 704-293-8524   °[facility] [address], [place] 336-845-3988 Admissions: 8am-3pm M-F  °[facility] [address]    °[place], [place] 336-841-1104   °[facility] [address], [place], [place] 336-379-7146   °[facility] [address]  °[place], [place] 336-285-9073   °[facility] - Intensive Outpatient [address], [place], [place] 336-852-3033   °[facility] [address]  °[place], [place] 1-877-615-2722 or 336-784-9470   °[facility] [address] [place], [place] 336-227-7417 Accepts Medicaid  °[facility] [address]  °[place] 1-800-659-3381 Substance Abuse/Addiction Treatment  ° °[place] Behavioral Health Resources °Organization         Address  Phone  Notes  °[facility]  (888) 581-9988   °Julie Brannon, PhD [address] [place], [place]   (336) 349-5553 or (336) 951-0000   °[facility]   [address] °[place], [place] (336) 349-4454   °[facility] [address], [place], [place] (336) 342-8316 Insurance/Medicaid/sponsorship through [facility]  °[facility] [address]                                    [place], [place] (336) 342-8316 Therapy/tele-psych/case    °[facility] [address]  ° [place], [place] (336) 349-2233    °Dr. Arfeen  (336) 349-4544   °[facility] [place]  [employer] [place] [facility] 1) [address], [place] °2) [address], [place] °3)  [address], [place] (336) 349-3220 °(336) 342-7768 ° °(336) 342-8140   °[place] [facility] (336) 342-1394 or (336) 342-3537 (After Hours)    ° ° °Take the prescriptions as directed.  Call your regular medical doctor on Monday to schedule a follow up appointment within the next 3 days.  Return to the Emergency Department immediately sooner if worsening.  ° °

## 2015-06-18 NOTE — ED Notes (Signed)
Pt discharged per MD instruction- She verbalizes understanding as well as family of DC instruct, med regime and followup. She refused offer of wheel chair and ambulated to the exit with family

## 2015-06-18 NOTE — ED Notes (Signed)
Patient complaining of cough starting last night with vomiting and body aches starting this morning.

## 2015-06-18 NOTE — ED Notes (Signed)
Orthostatic VS per orders- charted.Strep obtained and to lab Pt speaking in complete sentences at this time, without complaint of nausea. She also is drinking pos at bedside while speaking of her grandson and dog and how she arrived at [place]

## 2015-06-18 NOTE — ED Provider Notes (Signed)
CSN: [id number]     Arrival date & time 06/18/15  1110 History   First MD Initiated Contact with Patient 06/18/15 1442     Chief Complaint  Patient presents with  . Emesis  . Generalized Body Aches  . Cough      HPI Pt was seen at 1445.  Per pt's family and pt, c/o gradual onset and persistence of constant sore throat, runny/stuffy nose, sinus congestion, and cough for the past 2 to 3 days. Has been associated with generalized body aches and one episode of N/V today. Unclear if others in facility have similar symptoms.  Denies fevers, no rash, no CP/SOB, no diarrhea, no abd pain, no black or blood in stools or emesis.   Past Medical History  Diagnosis Date  . Cancer [facility])     breast cancer  . DDD (degenerative disc disease), lumbar    Past Surgical History  Procedure Laterality Date  . Breast surgery      double mastectomy  . Colonoscopy w/ polypectomy     Family History  Problem Relation Age of Onset  . Cancer Sister    Social History  Substance Use Topics  . Smoking status: Never Smoker   . Smokeless tobacco: Never Used  . Alcohol Use: No   OB History    Gravida Para Term Preterm AB TAB SAB Ectopic Multiple Living   1 1 1       1      Review of Systems ROS: Statement: All systems negative except as marked or noted in the HPI; Constitutional: Negative for fever and chills. +generalized body aches.; ; Eyes: Negative for eye pain, redness and discharge. ; ; ENMT: Negative for ear pain, hoarseness, +nasal congestion, sinus pressure and sore throat. ; ; Cardiovascular: Negative for chest pain, palpitations, diaphoresis, dyspnea and peripheral edema. ; ; Respiratory: +cough. Negative for wheezing and stridor. ; ; Gastrointestinal: +N/V. Negative for diarrhea, abdominal pain, blood in stool, hematemesis, jaundice and rectal bleeding. . ; ; Genitourinary: Negative for dysuria, flank pain and hematuria. ; ; Musculoskeletal: Negative for back pain and neck pain. Negative for  swelling and trauma.; ; Skin: Negative for pruritus, rash, abrasions, blisters, bruising and skin lesion.; ; Neuro: Negative for headache, lightheadedness and neck stiffness. Negative for weakness, altered level of consciousness , altered mental status, extremity weakness, paresthesias, involuntary movement, seizure and syncope.      Allergies  Tramadol; Penicillins; and Tape  Home Medications   Prior to Admission medications   Medication Sig Start Date End Date Taking? Authorizing Provider  aspirin EC 81 MG tablet Take 81 mg by mouth daily.    Historical Provider, MD  atorvastatin (LIPITOR) 10 MG tablet Take 10 mg by mouth daily. 04/06/13   Historical Provider, MD  busPIRone (BUSPAR) 10 MG tablet Take 10 mg by mouth daily. 12/22/13   Historical Provider, MD  cephALEXin (KEFLEX) 500 MG capsule Take 1 capsule (500 mg total) by mouth 4 (four) times daily. 02/21/14   Orpah Greek, MD  Cholecalciferol (VITAMIN D3) 2000 UNITS TABS Take 1 tablet by mouth daily.    Historical Provider, MD  HYDROcodone-acetaminophen (NORCO/VICODIN) 5-325 MG per tablet 1 tab PO q12 hours prn pain 08/04/14   Francine Graven, DO  letrozole [facility]) 2.5 MG tablet Take 2.5 mg by mouth daily. 04/06/13   Historical Provider, MD  oxyCODONE-acetaminophen (PERCOCET/ROXICET) 5-325 MG tablet Take 1 tablet by mouth every 4 (four) hours as needed. 06/09/15   Larene Pickett, PA-C  potassium  chloride SA (K-DUR,KLOR-CON) 20 MEQ tablet Take 40 mEq by mouth daily.    Historical Provider, MD  traMADol (ULTRAM) 50 MG tablet Take 1 tablet (50 mg total) by mouth every 6 (six) hours as needed. 02/21/14   Orpah Greek, MD  traMADol (ULTRAM) 50 MG tablet Take 1 tablet (50 mg total) by mouth every 6 (six) hours as needed. 08/02/14   Fredia Sorrow, MD   BP 159/80 mmHg  Pulse 98  Temp(Src) 97.7 F (36.5 C) (Oral)  Resp 20  Ht 5\' 5"  (1.651 m)  Wt 140 lb (63.504 kg)  BMI 23.30 kg/m2  SpO2 94%   15:43:04 Vital Signs MT   Vital Signs - Pulse Rate: 90 ; Pulse Rate Source: Monitor ; Bedside Cardiac Monitor On: Yes ; Resp: 18 ; BP: 180/73 mmHg ; Patient Position (if appropriate): Lying  Oxygen Therapy - SpO2: 95 % ; O2 Device: Room Air     15:44:10 Vital Signs MT  Vital Signs - Pulse Rate: 88 ; Pulse Rate Source: Monitor ; Bedside Cardiac Monitor On: Yes ; Resp: 20 ; BP: 146/85 mmHg ; BP Location: Left Arm ; BP Method: Automatic ; Patient Position (if appropriate): Sitting  Oxygen Therapy - SpO2: 98 % ; O2 Device: Room Air     15:44:55 Vital Signs MT  Vital Signs - Pulse Rate: 98 ; Pulse Rate Source: Monitor ; Bedside Cardiac Monitor On: Yes ; Resp: 20 ; BP: 159/80 mmHg ; BP Location: Left Arm ; BP Method: Automatic ; Patient Position (if appropriate): Standing  Oxygen Therapy - SpO2: 94 % ; O2 Device: Room Air     Physical Exam 1450: Physical examination:  Nursing notes reviewed; Vital signs and O2 SAT reviewed;  Constitutional: Well developed, Well nourished, Well hydrated, In no acute distress; Head:  Normocephalic, atraumatic; Eyes: EOMI, PERRL, No scleral icterus; ENMT: TM's clear bilat. +edemetous nasal turbinates bilat with clear rhinorrhea. Mouth and pharynx without lesions. No tonsillar exudates. No intra-oral edema. No submandibular or sublingual edema. No hoarse voice, no drooling, no stridor. No pain with manipulation of larynx. No trismus. Mouth and pharynx normal, Mucous membranes moist; Neck: Supple, Full range of motion, No lymphadenopathy; Cardiovascular: Regular rate and rhythm, No gallop; Respiratory: Breath sounds clear & equal bilaterally, No wheezes.  Speaking full sentences with ease, Normal respiratory effort/excursion; Chest: Nontender, Movement normal; Abdomen: Soft, Nontender, Nondistended, Normal bowel sounds; Genitourinary: No CVA tenderness; Extremities: Pulses normal, No tenderness, No edema, No calf edema or asymmetry.; Neuro: AA&Ox3, Major CN grossly intact. No facial droop. Speech  clear. No gross focal motor or sensory deficits in extremities. Climbs on and off stretcher easily by herself. Gait steady.; Skin: Color normal, Warm, Dry.   ED Course  Procedures (including critical care time) Labs Review  Imaging Review  I have personally reviewed and evaluated these images and lab results as part of my medical decision-making.   EKG Interpretation   Date/Time:  Friday June 18 2015 15:21:15 EST Ventricular Rate:  82 PR Interval:  206 QRS Duration: 97 QT Interval:  418 QTC Calculation: 488 R Axis:   -24 Text Interpretation:  Sinus rhythm Ventricular premature complex  Borderline left axis deviation Borderline prolonged QT interval When  compared with ECG of 12/30/2009 QT has lengthened Otherwise no significant  change Confirmed by [facility]  MD, Nunzio Cory [phone]) on 06/18/2015 4:02:06 PM      MDM  MDM Reviewed: previous chart, nursing note and vitals Reviewed previous: labs and ECG Interpretation: labs, ECG  and x-ray     Results for orders placed or performed during the hospital encounter of 06/18/15  Rapid strep screen  Result Value Ref Range   Streptococcus, Group A Screen (Direct) POSITIVE (A) NEGATIVE  CBC with Differential  Result Value Ref Range   WBC 6.2 4.0 - 10.5 K/uL   RBC 3.81 (L) 3.87 - 5.11 MIL/uL   Hemoglobin 11.6 (L) 12.0 - 15.0 g/dL   HCT 34.5 (L) 36.0 - 46.0 %   MCV 90.6 78.0 - 100.0 fL   MCH 30.4 26.0 - 34.0 pg   MCHC 33.6 30.0 - 36.0 g/dL   RDW 12.6 11.5 - 15.5 %   Platelets 163 150 - 400 K/uL   Neutrophils Relative % 80 %   Neutro Abs 4.9 1.7 - 7.7 K/uL   Lymphocytes Relative 12 %   Lymphs Abs 0.8 0.7 - 4.0 K/uL   Monocytes Relative 8 %   Monocytes Absolute 0.5 0.1 - 1.0 K/uL   Eosinophils Relative 0 %   Eosinophils Absolute 0.0 0.0 - 0.7 K/uL   Basophils Relative 0 %   Basophils Absolute 0.0 0.0 - 0.1 K/uL  Basic metabolic panel  Result Value Ref Range   Sodium 141 135 - 145 mmol/L   Potassium 3.8 3.5 - 5.1 mmol/L    Chloride 102 101 - 111 mmol/L   CO2 30 22 - 32 mmol/L   Glucose, Bld 119 (H) 65 - 99 mg/dL   BUN 16 6 - 20 mg/dL   Creatinine, Ser 0.75 0.44 - 1.00 mg/dL   Calcium 9.3 8.9 - 10.3 mg/dL   GFR calc non Af Amer >60 >60 mL/min   GFR calc Af Amer >60 >60 mL/min   Anion gap 9 5 - 15  Lipase, blood  Result Value Ref Range   Lipase 28 11 - 51 U/L  Hepatic function panel  Result Value Ref Range   Total Protein 7.0 6.5 - 8.1 g/dL   Albumin 4.3 3.5 - 5.0 g/dL   AST 23 15 - 41 U/L   ALT 18 14 - 54 U/L   Alkaline Phosphatase 78 38 - 126 U/L   Total Bilirubin 0.8 0.3 - 1.2 mg/dL   Bilirubin, Direct 0.2 0.1 - 0.5 mg/dL   Indirect Bilirubin 0.6 0.3 - 0.9 mg/dL  Troponin I  Result Value Ref Range   Troponin I <0.03 <0.031 ng/mL  Urinalysis, Routine w reflex microscopic  Result Value Ref Range   Color, Urine YELLOW YELLOW   APPearance CLEAR CLEAR   Specific Gravity, Urine 1.010 1.005 - 1.030   pH 7.0 5.0 - 8.0   Glucose, UA NEGATIVE NEGATIVE mg/dL   Hgb urine dipstick NEGATIVE NEGATIVE   Bilirubin Urine NEGATIVE NEGATIVE   Ketones, ur NEGATIVE NEGATIVE mg/dL   Protein, ur NEGATIVE NEGATIVE mg/dL   Nitrite POSITIVE (A) NEGATIVE   Leukocytes, UA TRACE (A) NEGATIVE  Lactic acid, plasma  Result Value Ref Range   Lactic Acid, Venous 0.9 0.5 - 2.0 mmol/L  Urine microscopic-add on  Result Value Ref Range   Squamous Epithelial / LPF 0-5 (A) NONE SEEN   WBC, UA 0-5 0 - 5 WBC/hpf   RBC / HPF NONE SEEN 0 - 5 RBC/hpf   Bacteria, UA MANY (A) NONE SEEN   Dg Chest 2 View 06/18/2015  CLINICAL DATA:  Nonproductive cough. EXAM: CHEST  2 VIEW COMPARISON:  06/11/2013 ; 01/13/2013 FINDINGS: Atherosclerotic aortic arch. Thoracic spondylosis. Heart size within normal limits. Old healed left rib fractures.  Bony demineralization. Minimal chronic scarring along the lingula and left lower lobe. Chronic scarring at the right lung apex with some slight nodular component but unchanged from 2014. IMPRESSION: 1.  Mild chronic scarring in the lingula and left lower lobe. 2. Atherosclerotic aortic arch. 3. Bony demineralization. 4. Chronic scarring at the right lung apex, not changed from 2014. Electronically Signed   By: Van Clines M.D.   On: 06/18/2015 12:15    1810:  Not orthostatic on VS. Pt has tol PO well while in the ED without N/V. Abd remains benign, VSS. Will tx for UTI and strep throat.  Pt would like to go home now. Dx and testing d/w pt and family.  Questions answered.  Verb understanding, agreeable to d/c home with outpt f/u.   Francine Graven, DO 06/21/15 2159

## 2015-06-21 LAB — URINE CULTURE: Culture: >=100000

## 2015-06-23 ENCOUNTER — Telehealth (HOSPITAL_COMMUNITY): Payer: Self-pay

## 2015-06-23 NOTE — Telephone Encounter (Signed)
Post ED Visit - Positive Culture Follow-up  Culture report reviewed by antimicrobial stewardship pharmacist:  []  Elenor Quinones, Pharm.D. []  Heide Guile, Pharm.D., BCPS []  Parks Neptune, Pharm.D. [x]  Alycia Rossetti, Pharm.D., BCPS []  [place], [place].D., BCPS, AAHIVP []  Legrand Como, Pharm.D., BCPS, AAHIVP []  Milus Glazier, Pharm.D. []  Stephens November, Pharm.D.  Positive urine culture, >/= 100,000 colonies -> E Coli Treated with Nitrofurantoin, organism sensitive to the same and no further patient follow-up is required at this time.  Dortha Kern 06/23/2015, 9:26 AM

## 2016-08-28 ENCOUNTER — Ambulatory Visit (HOSPITAL_COMMUNITY): Payer: Self-pay

## 2016-09-14 ENCOUNTER — Encounter (HOSPITAL_COMMUNITY): Payer: Self-pay

## 2016-09-14 ENCOUNTER — Encounter (HOSPITAL_COMMUNITY): Payer: Medicare Other | Attending: Oncology | Admitting: Oncology

## 2016-09-14 DIAGNOSIS — C50919 Malignant neoplasm of unspecified site of unspecified female breast: Secondary | ICD-10-CM | POA: Insufficient documentation

## 2016-09-14 DIAGNOSIS — S2239XA Fracture of one rib, unspecified side, initial encounter for closed fracture: Secondary | ICD-10-CM | POA: Insufficient documentation

## 2016-09-14 DIAGNOSIS — C50912 Malignant neoplasm of unspecified site of left female breast: Principal | ICD-10-CM

## 2016-09-14 DIAGNOSIS — Z853 Personal history of malignant neoplasm of breast: Secondary | ICD-10-CM

## 2016-09-14 DIAGNOSIS — Z79811 Long term (current) use of aromatase inhibitors: Secondary | ICD-10-CM

## 2016-09-14 DIAGNOSIS — C50911 Malignant neoplasm of unspecified site of right female breast: Secondary | ICD-10-CM

## 2016-09-14 DIAGNOSIS — S2249XA Multiple fractures of ribs, unspecified side, initial encounter for closed fracture: Secondary | ICD-10-CM | POA: Insufficient documentation

## 2016-09-14 NOTE — Patient Instructions (Addendum)
[facility] at [facility] Discharge Instructions  RECOMMENDATIONS MADE BY THE CONSULTANT AND ANY TEST RESULTS WILL BE SENT TO YOUR REFERRING PHYSICIAN.  You were seen today by Dr. Louise Zhou Follow up in 6 months   Thank you for choosing [facility] at [facility] to provide your oncology and hematology care.  To afford each patient quality time with our provider, please arrive at least 15 minutes before your scheduled appointment time.    If you have a lab appointment with the Cancer Center please come in thru the  Main Entrance and check in at the main information desk  You need to re-schedule your appointment should you arrive 10 or more minutes late.  We strive to give you quality time with our providers, and arriving late affects you and other patients whose appointments are after yours.  Also, if you no show three or more times for appointments you may be dismissed from the clinic at the providers discretion.     Again, thank you for choosing [facility].  Our hope is that these requests will decrease the amount of time that you wait before being seen by our physicians.       _____________________________________________________________  Should you have questions after your visit to [facility], please contact our office at [phone] between the hours of 8:30 a.m. and 4:30 p.m.  Voicemails left after 4:30 p.m. will not be returned until the following business day.  For prescription refill requests, have your pharmacy contact our office.       Resources For Cancer Patients and their Caregivers ? American Cancer Society: Can assist with transportation, wigs, general needs, runs Look Good Feel Better.        1-888-227-6333 ? Cancer Care: Provides financial assistance, online support groups, medication/co-pay assistance.  1-800-813-HOPE (4673) ? [facility] Assists [place]  cancer patients and their families through emotional , educational and financial support.  336-427-4357 ? [place] DSS Where to apply for food stamps, Medicaid and utility assistance. 336-342-1394 ? [facility]: Transportation to medical appointments. 336-347-2287 ? Social Security Administration: May apply for disability if have a Stage IV cancer. 336-342-7796 1-800-772-1213 ? [place] Aging, Disability and Transit Services: Assists with nutrition, care and transit needs. 336-349-2343  Cancer Center Support Programs: @10RELATIVEDAYS@ > Cancer Support Group  2nd Tuesday of the month 1pm-2pm, Journey Room  > Creative Journey  3rd Tuesday of the month 1130am-1pm, Journey Room  > Look Good Feel Better  1st Wednesday of the month 10am-12 noon, Journey Room (Call American Cancer Society to register 1-800-395-5775)    

## 2016-09-14 NOTE — Progress Notes (Signed)
[place]  CONSULT NOTE  Patient Care Team: Rory Percy, MD as PCP - General (Family Medicine)  CHIEF COMPLAINTS/PURPOSE OF CONSULTATION:  Establishment of care for prior bilateral breast cancers  HISTORY OF PRESENTING ILLNESS:  Paige Guerrero 81 y.o. female is here because of history of bilateral breast cancers. The first was in October 2006 and the second on the right side was in March 2013.  According to Dr. Pieter Partridge note from 01/20/16, "her first cancer was ER positive at greater than 90%, PR receptors were 70% positive, HER-2/neu was negative. Ki-67 marker however was high and she had 3.8 cm primary that was grade 3. No lymph nodes were involved but she was treated with epirubicin and Cytoxan 2 cycles but stopped due to intolerance. She was then given Arimidex.  In March 2013 she had a right-sided breast cancer 1.5 cm, grade 3 that was ER +90%, PR positive only 5%, HER-2/neu not amplified and Ki-67 marker was also high at 32%. This was a T1c, N1 cancer of the right breast that she took radiation therapy postoperatively after her modified radical mastectomy. She was found to have 2 lymph nodes. She was then placed on letrozole and continues to take that. She has no problems taken that drug."  The patient is currently residing at [facility], and reports "I do what I want to do, and if I don't want to I don't do it."  The patient presents to the clinic today to establish care. Her granddaughter drove her to the clinic today.  She is currently taking "cancer medicine" which she believes to be Femara. She denies any palpable nodules on chest wall bilaterally. She denies chest pain, shortness of breath, or abdominal pain. She reports she has not followed with Oncology since 2016 when she last saw Dr. Tressie Stalker. She does not know who manages her Femara prescription at this time. She reports the nursing home gives her medicine and she "just take[s] it." She  is unsure if she takes calcium supplements. She would like to continue taking Femara at this time. The patient denies hot flashes.   The patient notes that she had a fall once, years ago, when she was living in a home with her grandson. She has not fallen since then.   MEDICAL HISTORY:  Past Medical History:  Diagnosis Date  . Cancer [facility])    breast cancer  . DDD (degenerative disc disease), lumbar     SURGICAL HISTORY: Past Surgical History:  Procedure Laterality Date  . BREAST SURGERY     double mastectomy  . COLONOSCOPY W/ POLYPECTOMY      SOCIAL HISTORY: Social History   Social History  . Marital status: Divorced    Spouse name: N/A  . Number of children: N/A  . Years of education: N/A   Occupational History  . Not on file.   Social History Main Topics  . Smoking status: Never Smoker  . Smokeless tobacco: Never Used  . Alcohol use No  . Drug use: No  . Sexual activity: No   Other Topics Concern  . Not on file   Social History Narrative  . No narrative on file    FAMILY HISTORY: Family History  Problem Relation Age of Onset  . Cancer Sister     ALLERGIES:  is allergic to doxycycline; tramadol; penicillins; and tape.  MEDICATIONS:  Current Outpatient Prescriptions  Medication Sig Dispense Refill  . acetaminophen (TYLENOL) 325 MG tablet Take 650 mg  by mouth every 6 (six) hours as needed for mild pain or moderate pain.    Marland Kitchen aspirin EC 81 MG tablet Take 81 mg by mouth daily.    Marland Kitchen atorvastatin (LIPITOR) 10 MG tablet Take 10 mg by mouth daily.    . clindamycin (CLEOCIN) 150 MG capsule 3 tabs PO TID x 10 days 90 capsule 0  . letrozole (FEMARA) 2.5 MG tablet Take 2.5 mg by mouth daily.    Marland Kitchen lisinopril-hydrochlorothiazide (PRINZIDE,ZESTORETIC) 10-12.5 MG tablet Take 1 tablet by mouth daily.     . nitrofurantoin, macrocrystal-monohydrate, (MACROBID) 100 MG capsule Take 1 capsule (100 mg total) by mouth 2 (two) times daily. 14 capsule 0  .  oxyCODONE-acetaminophen (PERCOCET/ROXICET) 5-325 MG tablet Take 1 tablet by mouth every 4 (four) hours as needed. 15 tablet 0  . potassium chloride (K-DUR,KLOR-CON) 10 MEQ tablet Take 10 mEq by mouth daily.      No current facility-administered medications for this visit.     Review of Systems  Constitutional: Negative.  Negative for fever, malaise/fatigue and weight loss.  HENT: Negative.   Eyes: Negative.   Respiratory: Negative.  Negative for shortness of breath.   Cardiovascular: Negative.  Negative for chest pain.  Gastrointestinal: Negative.  Negative for abdominal pain.  Genitourinary: Negative.   Musculoskeletal: Negative.  Negative for falls (One fall several years ago, none since.).  Skin: Negative.   Neurological: Negative.   Endo/Heme/Allergies: Negative.   Psychiatric/Behavioral: Negative.   All other systems reviewed and are negative.  14 point ROS was done and is otherwise as detailed above or in HPI   PHYSICAL EXAMINATION: ECOG PERFORMANCE STATUS: 0 - Asymptomatic  Vitals:   09/14/16 1314  BP: (!) 172/75  Pulse: 84  Resp: 16  Temp: 98.1 F (36.7 C)   Filed Weights   09/14/16 1314  Weight: 142 lb 8 oz (64.6 kg)     Physical Exam  Constitutional: She is oriented to person, place, and time and well-developed, well-nourished, and in no distress.  HENT:  Head: Normocephalic and atraumatic.  Eyes: Conjunctivae and EOM are normal. Pupils are equal, round, and reactive to light.  Neck: Normal range of motion. Neck supple.  Cardiovascular: Normal rate, regular rhythm and normal heart sounds.  Exam reveals no gallop and no friction rub.   No murmur heard. Pulmonary/Chest: Effort normal and breath sounds normal. No respiratory distress. She has no wheezes. She has no rales. She exhibits no tenderness. Right breast exhibits no mass and no tenderness. Left breast exhibits no mass and no tenderness.    Abdominal: Soft. Bowel sounds are normal. She exhibits no  distension and no mass. There is no tenderness. There is no rebound and no guarding.  Musculoskeletal: Normal range of motion. She exhibits no edema.  Neurological: She is alert and oriented to person, place, and time. Gait normal.  Skin: Skin is warm and dry.  Nursing note and vitals reviewed.   LABORATORY DATA:  I have reviewed the data as listed Lab Results  Component Value Date   WBC 6.2 06/18/2015   HGB 11.6 (L) 06/18/2015   HCT 34.5 (L) 06/18/2015   MCV 90.6 06/18/2015   PLT 163 06/18/2015   CMP     Component Value Date/Time   NA 141 06/18/2015 1600   K 3.8 06/18/2015 1600   CL 102 06/18/2015 1600   CO2 30 06/18/2015 1600   GLUCOSE 119 (H) 06/18/2015 1600   BUN 16 06/18/2015 1600   CREATININE 0.75 06/18/2015 1600  CALCIUM 9.3 06/18/2015 1600   CALCIUM 10.5 12/30/2009 0930   PROT 7.0 06/18/2015 1600   ALBUMIN 4.3 06/18/2015 1600   AST 23 06/18/2015 1600   ALT 18 06/18/2015 1600   ALKPHOS 78 06/18/2015 1600   BILITOT 0.8 06/18/2015 1600   GFRNONAA >60 06/18/2015 1600   GFRAA >60 06/18/2015 1600     RADIOGRAPHIC STUDIES: I have personally reviewed the radiological images as listed and agreed with the findings in the report. No results found.  ASSESSMENT & PLAN:  Bilateral Breast Cancer 1. Left breast cancer ER/PR positive HER-2 negative T2N0M0 (dx October 2006) s/p chemo with Epirubicin and Cytoxan 2 cycles but stopped due to intolerance, mastectomy, and adjuvant endocrine therapy with  Arimidex. 2. Right breast cancer ER/PR positive HER-2 negative T1cN1M0 s/p mastectomy followed by radiation, and adjuvant endocrine therapy with Femara.  PLAN: - Clinically NED on exam. - The patient has completed 5 years of therapy with Femara as of March 2018. However she is concerned about a recurrence and she would like to continue taking it at this time, therefore we will plan to continue femara for total of 10 years. - RTC in 6 months for follow up.   All questions  were answered. The patient knows to call the clinic with any problems, questions or concerns.  I spent 40 minutes counseling the patient face to face. The total time spent in the appointment was 55 minutes and more than 50% was on counseling and review of test results  This document serves as a record of services personally performed by Twana First, MD. It was created on her behalf by Maryla Morrow, a trained medical scribe. The creation of this record is based on the scribe's personal observations and the provider's statements to them. This document has been checked and approved by the attending provider.  This note was electronically signed by:   Twana First, MD 09/14/2016 1:23 PM

## 2017-03-20 ENCOUNTER — Ambulatory Visit (HOSPITAL_COMMUNITY): Payer: Self-pay

## 2017-04-11 ENCOUNTER — Encounter (HOSPITAL_COMMUNITY): Payer: Self-pay

## 2017-04-11 ENCOUNTER — Other Ambulatory Visit: Payer: Self-pay

## 2017-04-11 ENCOUNTER — Encounter (HOSPITAL_COMMUNITY): Payer: Medicare Other | Attending: Oncology | Admitting: Oncology

## 2017-04-11 VITALS — BP 159/75 | HR 94 | Temp 98.5°F | Resp 16 | Ht 65.5 in | Wt 143.0 lb

## 2017-04-11 DIAGNOSIS — Z79811 Long term (current) use of aromatase inhibitors: Secondary | ICD-10-CM

## 2017-04-11 DIAGNOSIS — C50411 Malignant neoplasm of upper-outer quadrant of right female breast: Secondary | ICD-10-CM | POA: Diagnosis present

## 2017-04-11 DIAGNOSIS — Z9221 Personal history of antineoplastic chemotherapy: Secondary | ICD-10-CM

## 2017-04-11 DIAGNOSIS — Z9013 Acquired absence of bilateral breasts and nipples: Secondary | ICD-10-CM | POA: Diagnosis not present

## 2017-04-11 DIAGNOSIS — Z170 Estrogen receptor positive status [ER+]: Secondary | ICD-10-CM | POA: Diagnosis not present

## 2017-04-11 DIAGNOSIS — Z853 Personal history of malignant neoplasm of breast: Secondary | ICD-10-CM | POA: Diagnosis not present

## 2017-04-11 NOTE — Progress Notes (Signed)
[facility]  CONSULT NOTE  Patient Care Team: Rory Percy, MD as PCP - General (Family Medicine)  CHIEF COMPLAINTS/PURPOSE OF CONSULTATION:  Establishment of care for prior bilateral breast cancers  HISTORY OF PRESENTING ILLNESS:  Paige Guerrero 81 y.o. female is here because of history of bilateral breast cancers. The first was in October 2006 and the second on the right side was in March 2013.  According to Dr. Pieter Partridge note from 01/20/16, "her first cancer was ER positive at greater than 90%, PR receptors were 70% positive, HER-2/neu was negative. Ki-67 marker however was high and she had 3.8 cm primary that was grade 3. No lymph nodes were involved but she was treated with epirubicin and Cytoxan 2 cycles but stopped due to intolerance. She was then given Arimidex.  In March 2013 she had a right-sided breast cancer 1.5 cm, grade 3 that was ER +90%, PR positive only 5%, HER-2/neu not amplified and Ki-67 marker was also high at 32%. This was a T1c, N1 cancer of the right breast that she took radiation therapy postoperatively after her modified radical mastectomy. She was found to have 2 lymph nodes. She was then placed on letrozole and continues to take that. She has no problems taken that drug."  INTERVAL HISTORY: Patient presents today for continued follow up. She states she has been doing well. She continues to take femara without any side effects. She denies any new lesions on her chest wall or axilla. Denies any chest pain, shortness of breath, abdominal pain, N/V/D, focal weakness. No recent infections or fevers or chills.   MEDICAL HISTORY:  Past Medical History:  Diagnosis Date  . Cancer [facility])    breast cancer  . DDD (degenerative disc disease), lumbar     SURGICAL HISTORY: Past Surgical History:  Procedure Laterality Date  . BREAST SURGERY     double mastectomy  . COLONOSCOPY W/ POLYPECTOMY      SOCIAL HISTORY: Social History    Socioeconomic History  . Marital status: Divorced    Spouse name: Not on file  . Number of children: Not on file  . Years of education: Not on file  . Highest education level: Not on file  Social Needs  . Financial resource strain: Not on file  . Food insecurity - worry: Not on file  . Food insecurity - inability: Not on file  . Transportation needs - medical: Not on file  . Transportation needs - non-medical: Not on file  Occupational History  . Not on file  Tobacco Use  . Smoking status: Never Smoker  . Smokeless tobacco: Never Used  Substance and Sexual Activity  . Alcohol use: No  . Drug use: No  . Sexual activity: No  Other Topics Concern  . Not on file  Social History Narrative  . Not on file    FAMILY HISTORY: Family History  Problem Relation Age of Onset  . Cancer Sister     ALLERGIES:  is allergic to doxycycline; tramadol; penicillins; and tape.  MEDICATIONS:  Current Outpatient Medications  Medication Sig Dispense Refill  . aspirin EC 81 MG tablet Take 81 mg by mouth daily.    Marland Kitchen atorvastatin (LIPITOR) 10 MG tablet Take 10 mg by mouth daily.    . busPIRone (BUSPAR) 15 MG tablet     . hydrochlorothiazide (HYDRODIURIL) 25 MG tablet     . HYDROcodone-acetaminophen (NORCO/VICODIN) 5-325 MG tablet     . letrozole (FEMARA) 2.5 MG tablet Take  2.5 mg by mouth daily.    . polyethylene glycol powder (GLYCOLAX/MIRALAX) powder     . potassium chloride (K-DUR,KLOR-CON) 10 MEQ tablet Take 10 mEq by mouth daily.      No current facility-administered medications for this visit.     Review of Systems  Constitutional: Negative.  Negative for fever, malaise/fatigue and weight loss.  HENT: Negative.   Eyes: Negative.   Respiratory: Negative.  Negative for shortness of breath.   Cardiovascular: Negative.  Negative for chest pain.  Gastrointestinal: Negative.  Negative for abdominal pain.  Genitourinary: Negative.   Musculoskeletal: Negative.  Negative for falls (One  fall several years ago, none since.).  Skin: Negative.   Neurological: Negative.   Endo/Heme/Allergies: Negative.   Psychiatric/Behavioral: Negative.   All other systems reviewed and are negative.  14 point ROS was done and is otherwise as detailed above or in HPI   PHYSICAL EXAMINATION: ECOG PERFORMANCE STATUS: 0 - Asymptomatic  Vitals:   04/11/17 1549  BP: (!) 159/75  Pulse: 94  Resp: 16  Temp: 98.5 F (36.9 C)  SpO2: 96%   Filed Weights   04/11/17 1549  Weight: 143 lb (64.9 kg)     Physical Exam  Constitutional: She is oriented to person, place, and time and well-developed, well-nourished, and in no distress.  HENT:  Head: Normocephalic and atraumatic.  Eyes: Conjunctivae and EOM are normal. Pupils are equal, round, and reactive to light.  Neck: Normal range of motion. Neck supple.  Cardiovascular: Normal rate, regular rhythm and normal heart sounds. Exam reveals no gallop and no friction rub.  No murmur heard. Pulmonary/Chest: Effort normal and breath sounds normal. No respiratory distress. She has no wheezes. She has no rales. She exhibits no tenderness. Right breast exhibits no mass and no tenderness. Left breast exhibits no mass and no tenderness.    Abdominal: Soft. Bowel sounds are normal. She exhibits no distension and no mass. There is no tenderness. There is no rebound and no guarding.  Musculoskeletal: Normal range of motion. She exhibits no edema.  Neurological: She is alert and oriented to person, place, and time. Gait normal.  Skin: Skin is warm and dry.  Nursing note and vitals reviewed.   LABORATORY DATA:  I have reviewed the data as listed Lab Results  Component Value Date   WBC 6.2 06/18/2015   HGB 11.6 (L) 06/18/2015   HCT 34.5 (L) 06/18/2015   MCV 90.6 06/18/2015   PLT 163 06/18/2015   CMP     Component Value Date/Time   NA 141 06/18/2015 1600   K 3.8 06/18/2015 1600   CL 102 06/18/2015 1600   CO2 30 06/18/2015 1600   GLUCOSE 119  (H) 06/18/2015 1600   BUN 16 06/18/2015 1600   CREATININE 0.75 06/18/2015 1600   CALCIUM 9.3 06/18/2015 1600   CALCIUM 10.5 12/30/2009 0930   PROT 7.0 06/18/2015 1600   ALBUMIN 4.3 06/18/2015 1600   AST 23 06/18/2015 1600   ALT 18 06/18/2015 1600   ALKPHOS 78 06/18/2015 1600   BILITOT 0.8 06/18/2015 1600   GFRNONAA >60 06/18/2015 1600   GFRAA >60 06/18/2015 1600     RADIOGRAPHIC STUDIES: I have personally reviewed the radiological images as listed and agreed with the findings in the report. No results found.  ASSESSMENT & PLAN:  Bilateral Breast Cancer 1. Left breast cancer ER/PR positive HER-2 negative T2N0M0 (dx October 2006) s/p chemo with Epirubicin and Cytoxan 2 cycles but stopped due to intolerance, mastectomy, and adjuvant  endocrine therapy with  Arimidex. 2. Right breast cancer ER/PR positive HER-2 negative T1cN1M0 s/p mastectomy followed by radiation, and adjuvant endocrine therapy with Femara.  PLAN: - Clinically NED on exam. - The patient has completed 5 years of therapy with Femara as of March 2018. However she is concerned about a recurrence and she would like to continue taking it at this time, therefore we will plan to continue femara for total of 10 years. - RTC in 6 months for follow up with labs.  Orders Placed This Encounter  Procedures  . CBC with Differential    Standing Status:   Future    Standing Expiration Date:   04/11/2018  . Comprehensive metabolic panel    Standing Status:   Future    Standing Expiration Date:   04/11/2018    All questions were answered. The patient knows to call the clinic with any problems, questions or concerns.    This note was electronically signed by:   Twana First, MD 04/11/2017 3:54 PM

## 2017-06-08 DEATH — deceased

## 2017-10-01 ENCOUNTER — Other Ambulatory Visit (HOSPITAL_COMMUNITY): Payer: Self-pay | Admitting: *Deleted

## 2017-10-01 DIAGNOSIS — C50419 Malignant neoplasm of upper-outer quadrant of unspecified female breast: Secondary | ICD-10-CM

## 2017-10-03 ENCOUNTER — Other Ambulatory Visit (HOSPITAL_COMMUNITY): Payer: Medicare Other

## 2017-10-10 ENCOUNTER — Ambulatory Visit (HOSPITAL_COMMUNITY): Payer: Medicare Other | Admitting: Hematology

## 2017-10-10 ENCOUNTER — Other Ambulatory Visit (HOSPITAL_COMMUNITY): Payer: Self-pay
